# Patient Record
Sex: Male | Born: 1955
Health system: Southern US, Community
[De-identification: ages and names within clinical notes are randomized; demographics above are authoritative.]

## PROBLEM LIST (undated history)

## (undated) DIAGNOSIS — H3320 Serous retinal detachment, unspecified eye: Secondary | ICD-10-CM

## (undated) DIAGNOSIS — E785 Hyperlipidemia, unspecified: Secondary | ICD-10-CM

## (undated) DIAGNOSIS — H35039 Hypertensive retinopathy, unspecified eye: Secondary | ICD-10-CM

## (undated) DIAGNOSIS — I10 Essential (primary) hypertension: Secondary | ICD-10-CM

## (undated) HISTORY — PX: RETINAL DETACHMENT SURGERY: SHX105

## (undated) HISTORY — PX: CATARACT EXTRACTION: SUR2

## (undated) HISTORY — DX: Hypertensive retinopathy, unspecified eye: H35.039

## (undated) HISTORY — PX: EYE SURGERY: SHX253

## (undated) HISTORY — DX: Serous retinal detachment, unspecified eye: H33.20

## (undated) HISTORY — PX: HERNIA REPAIR: SHX51

## (undated) HISTORY — PX: OTHER SURGICAL HISTORY: SHX169

## (undated) HISTORY — DX: Essential (primary) hypertension: I10

## (undated) HISTORY — PX: YAG LASER APPLICATION: SHX6189

---

## 1999-07-24 ENCOUNTER — Encounter: Payer: Self-pay | Admitting: Emergency Medicine

## 1999-07-24 ENCOUNTER — Inpatient Hospital Stay (HOSPITAL_COMMUNITY): Admission: EM | Admit: 1999-07-24 | Discharge: 1999-07-25 | Payer: Self-pay | Admitting: *Deleted

## 2005-12-19 ENCOUNTER — Ambulatory Visit: Payer: Self-pay | Admitting: Emergency Medicine

## 2005-12-21 ENCOUNTER — Ambulatory Visit: Payer: Self-pay | Admitting: Cardiology

## 2006-01-05 ENCOUNTER — Ambulatory Visit: Payer: Self-pay | Admitting: Emergency Medicine

## 2006-01-05 ENCOUNTER — Ambulatory Visit (HOSPITAL_COMMUNITY): Admission: RE | Admit: 2006-01-05 | Discharge: 2006-01-05 | Payer: Self-pay | Admitting: Family Medicine

## 2007-11-07 IMAGING — CT CT CHEST W/ CM
2 of 4 series · 15 of 36 positions shown, 18 images · IV contrast (APPLIED)
Comparison: none

CLINICAL DATA: Hemoptysis.  Elevated blood pressure, on antibiotics. 
 CHEST CT WITH CONTRAST:
TECHNIQUE: Multidetector CT imaging of the chest was performed following the standard protocol during bolus administration of intravenous contrast.
 Contrast:  80 cc Omnipaque 300.

[Series 2: chest_routine 5.0 b40f st · axial · 0.78mm/px · z∈[-315,-30]mm · 12 of 67 slices shown, 15 images]
[im 5/67  mediastinal]
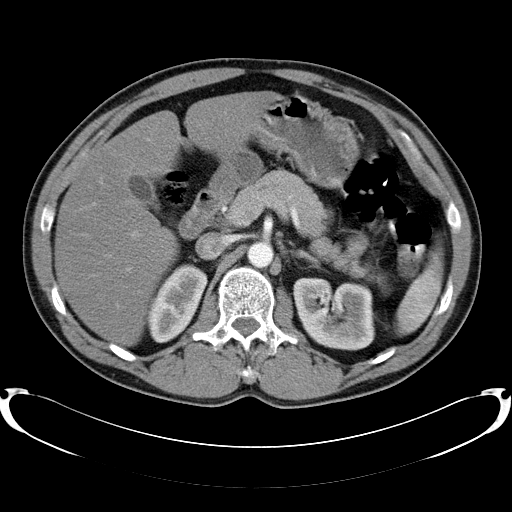
[im 5/67  lung]
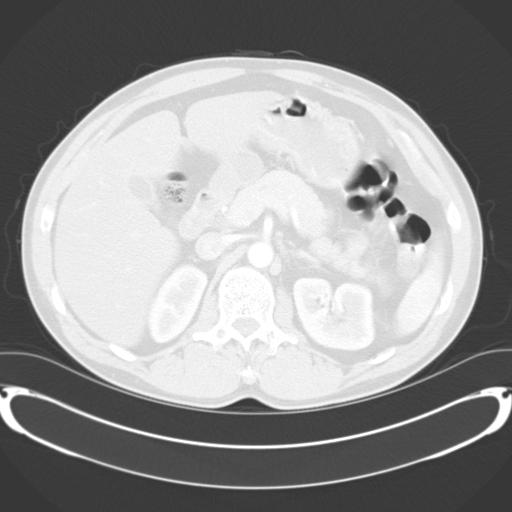
[im 10/67  lung]
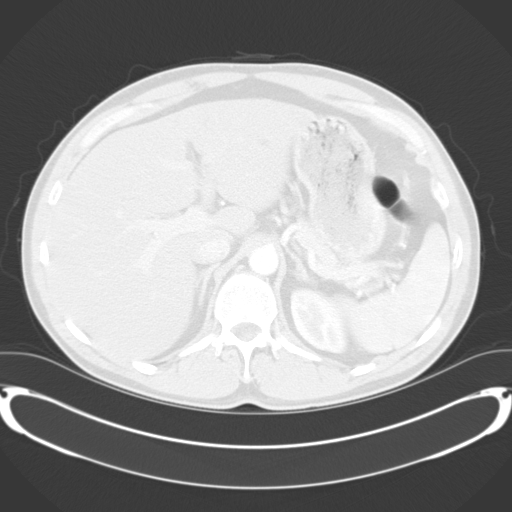
[im 15/67  lung]
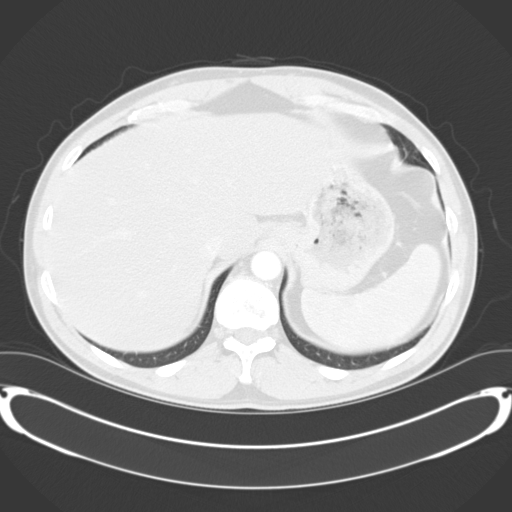
[im 19/67  lung]
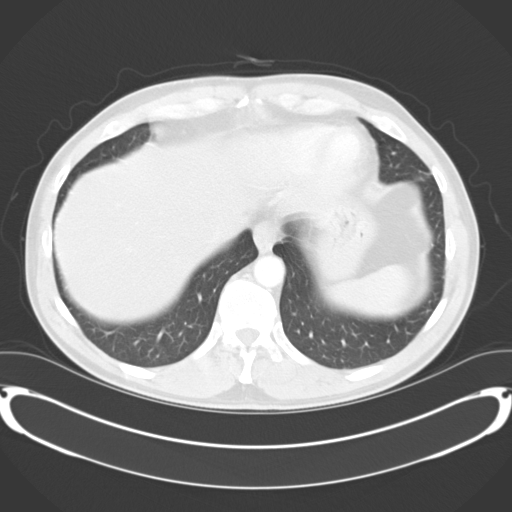
[im 24/67  mediastinal]
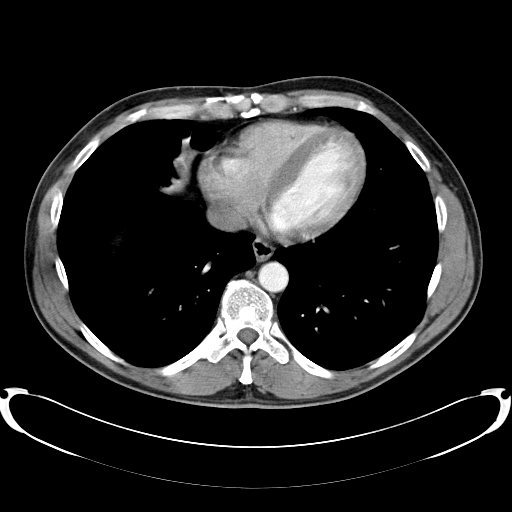
[im 24/67  lung]
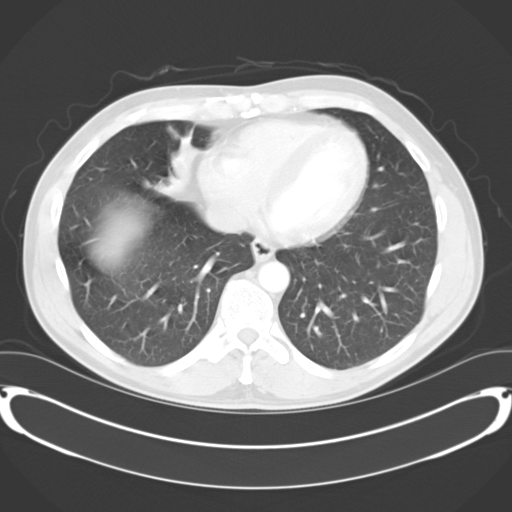
[im 29/67  lung]
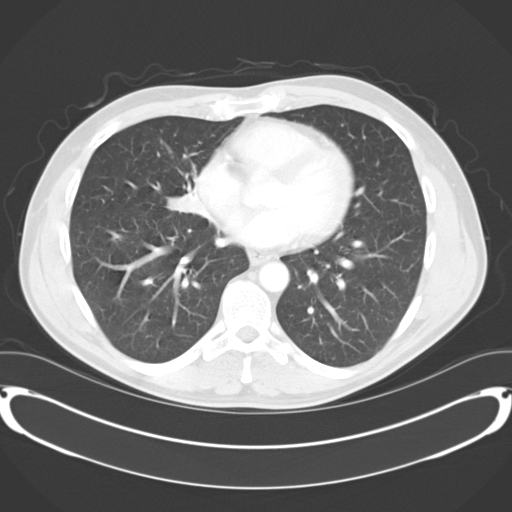
[im 38/67  lung]
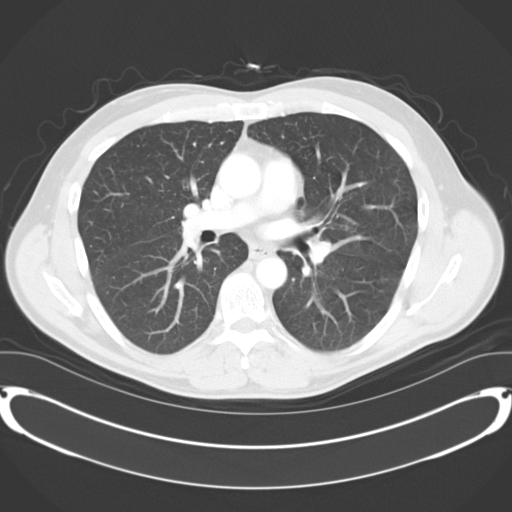
[im 43/67  lung]
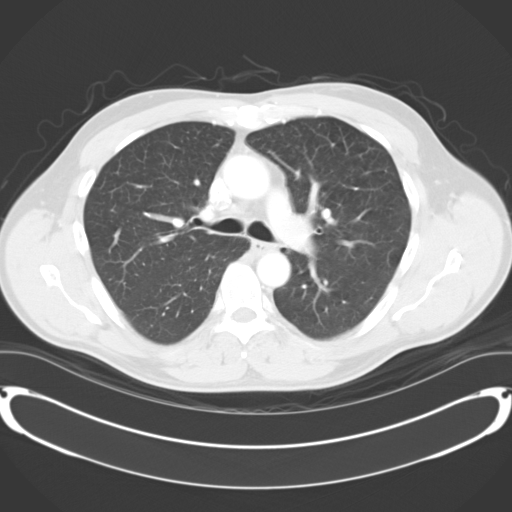
[im 48/67  mediastinal]
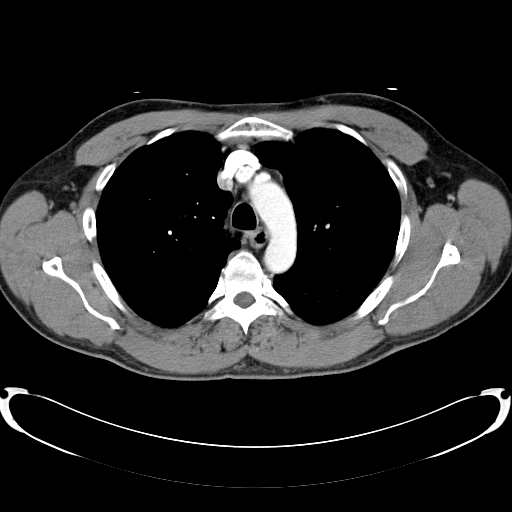
[im 48/67  lung]
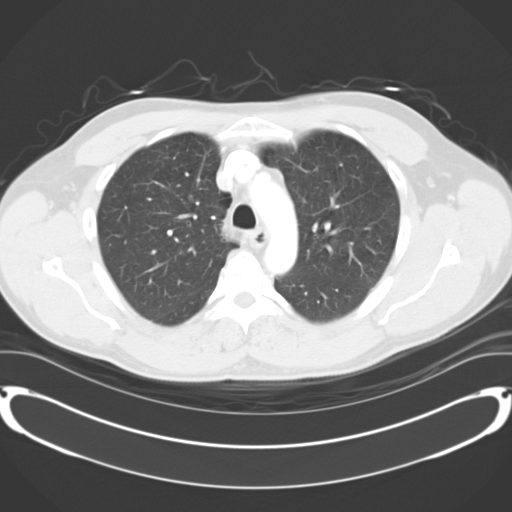
[im 52/67  lung]
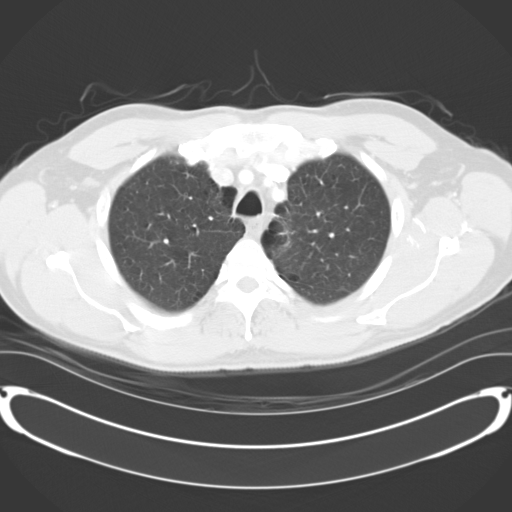
[im 57/67  lung]
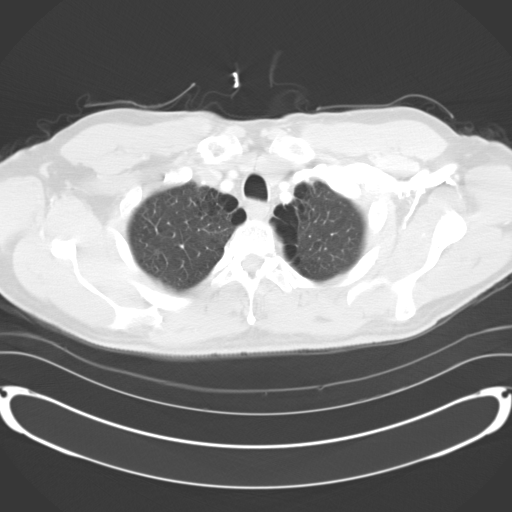
[im 62/67  lung]
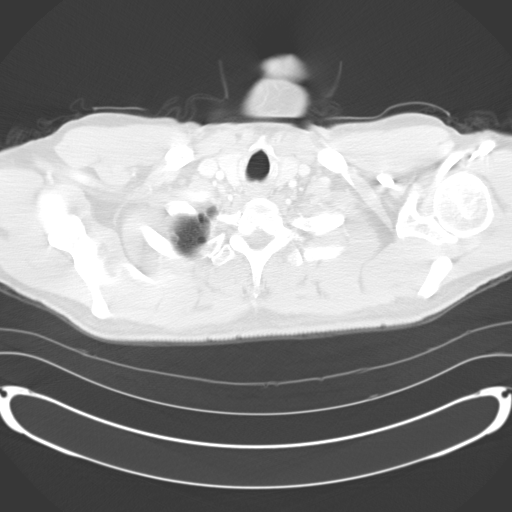

[Series 602: coronal images · coronal · 0.78mm/px · 3 of 39 slices shown]
[im 8/39  lung]
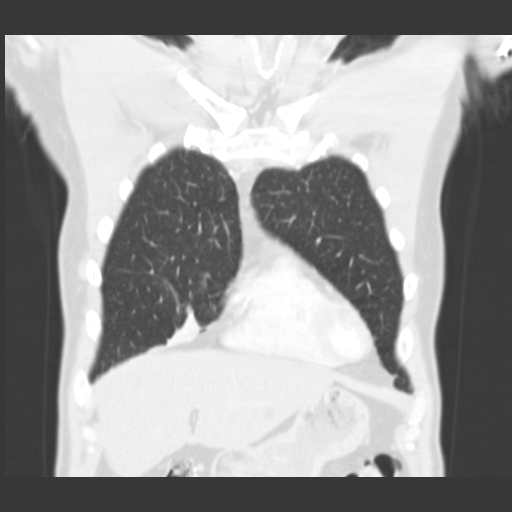
[im 16/39  lung]
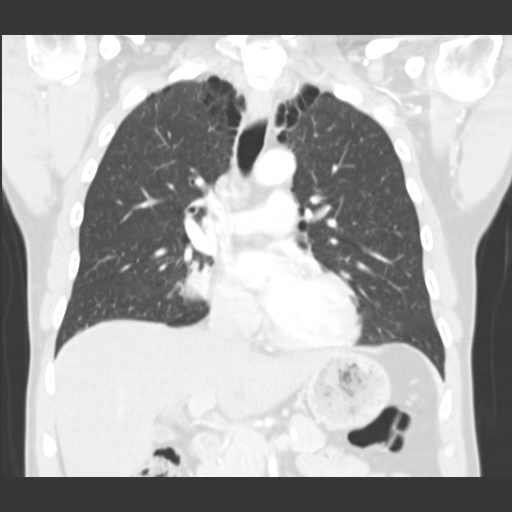
[im 23/39  lung]
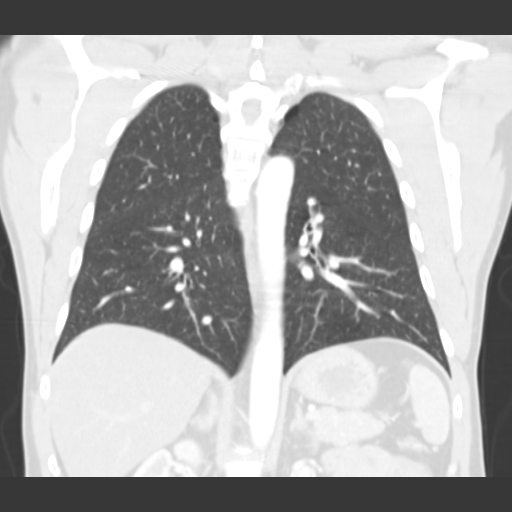

[15 of 36 positions shown; findings below may reference images not displayed]

FINDINGS: Bolus changes are noted primarily on the lung apices as well as medially around the hila.  No lung nodule is seen.   There is opacity within the anteromedial aspect of the right middle lobe consistent with scarring or atelectasis.   Follow-up by chest x-ray may be warranted to insure clearing of this opacity.  No effusion is seen.   No mediastinal or hilar adenopathy is seen.  The pulmonary arteries and thoracic aorta opacify with no significant abnormality noted.  Coronary artery calcifications are present.  There does appear to be diffuse fatty infiltration of the liver.
IMPRESSION: 1.  There is triangular opacity within the anteromedial right middle lobe of questionable significance.  This most likely represents scarring or atelectasis with pneumonia less likely.  Follow-up by chest x-ray is recommended.  No adenopathy. 
 2.  Diffuse fatty infiltration of the liver.  
 3.  Bullous changes particularly in the lung apices.

## 2007-11-22 IMAGING — CR DG CHEST 1V PORT
1 series · 1 of 1 positions shown · non-contrast
Comparison: CT chest of 12/21/05.

CLINICAL DATA: Right upper lobe bronchoscopy.  
 PORTABLE CHEST - 1 VIEW 01/05/06 AT 7736 HOURS:

[view not recorded]
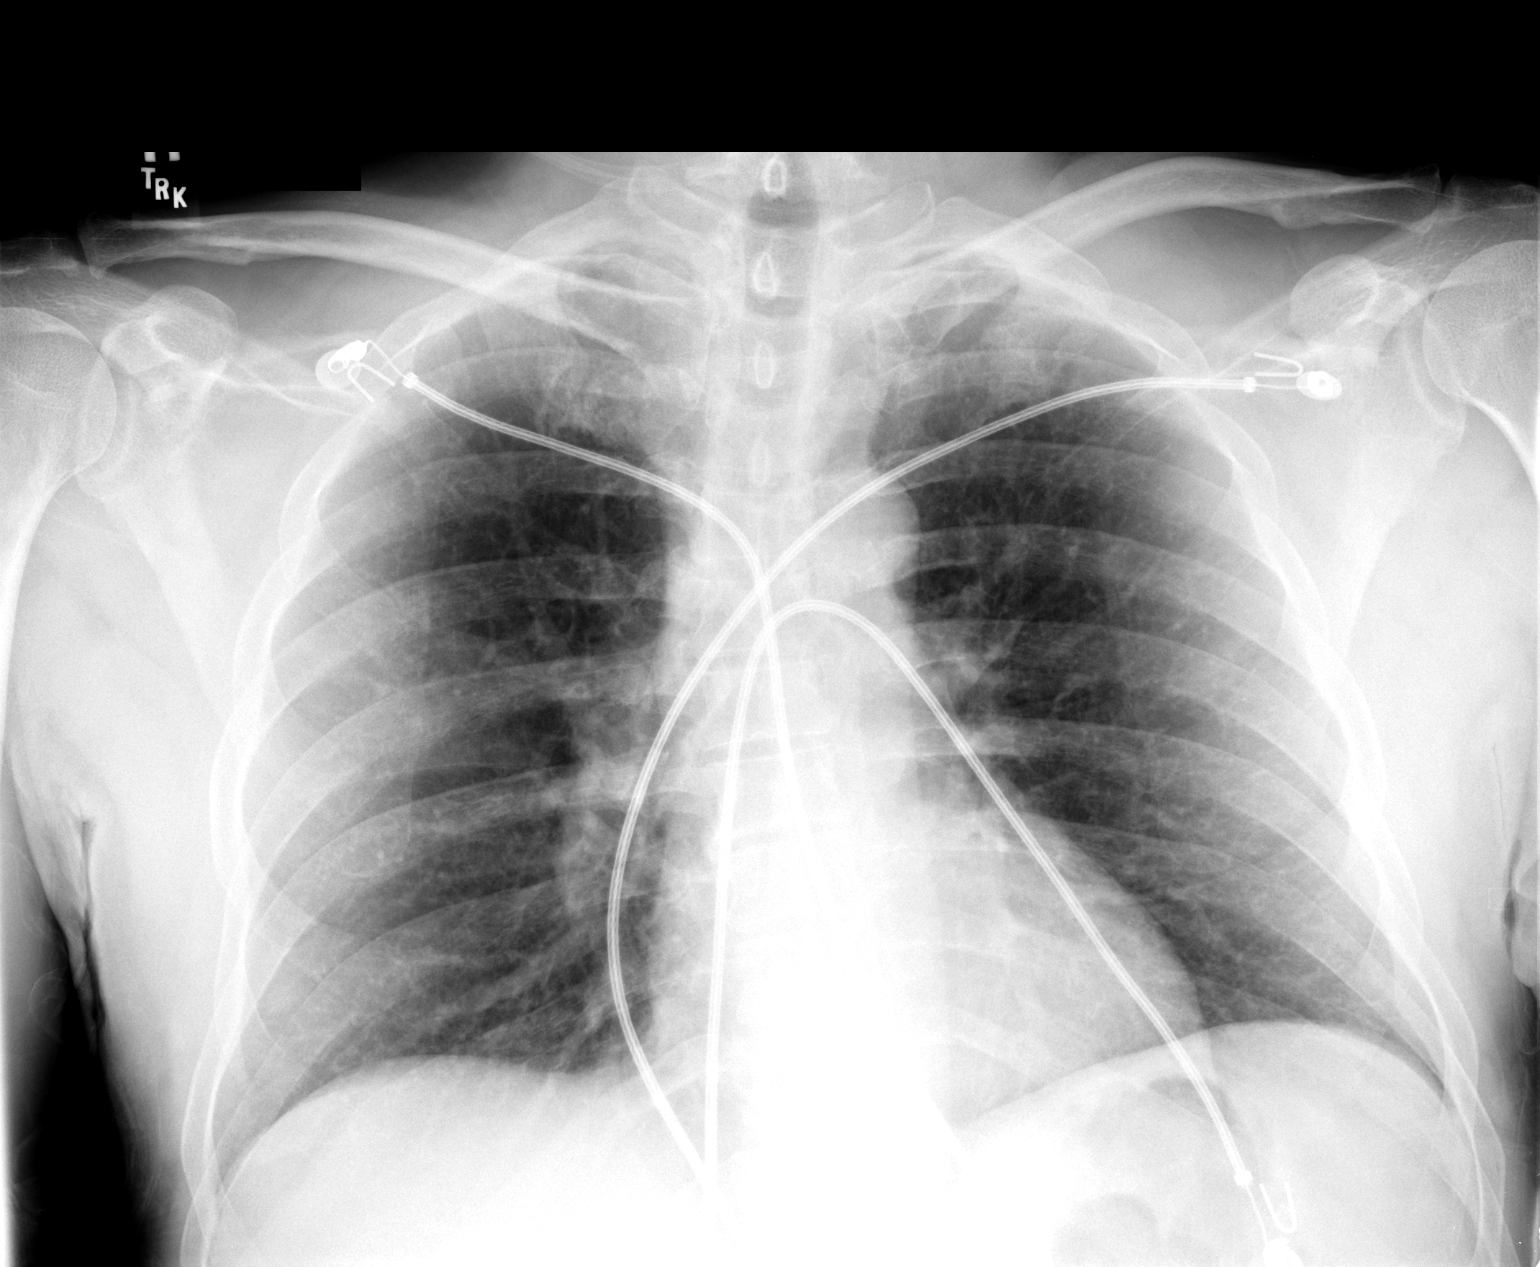

[1 of 1 positions shown; findings below may reference images not displayed]

FINDINGS: No active process is seen.  No pneumothorax is noted.  There is no evidence of effusion.  Heart size is normal.
IMPRESSION: No active lung disease.  No pneumothorax.

## 2015-04-05 DIAGNOSIS — H40003 Preglaucoma, unspecified, bilateral: Secondary | ICD-10-CM | POA: Diagnosis not present

## 2015-04-05 DIAGNOSIS — H2513 Age-related nuclear cataract, bilateral: Secondary | ICD-10-CM | POA: Diagnosis not present

## 2015-04-08 DIAGNOSIS — H2512 Age-related nuclear cataract, left eye: Secondary | ICD-10-CM | POA: Diagnosis not present

## 2015-05-01 DIAGNOSIS — E7800 Pure hypercholesterolemia, unspecified: Secondary | ICD-10-CM | POA: Diagnosis not present

## 2015-05-01 DIAGNOSIS — I10 Essential (primary) hypertension: Secondary | ICD-10-CM | POA: Diagnosis not present

## 2015-05-01 DIAGNOSIS — Z716 Tobacco abuse counseling: Secondary | ICD-10-CM | POA: Diagnosis not present

## 2015-05-01 DIAGNOSIS — Z139 Encounter for screening, unspecified: Secondary | ICD-10-CM | POA: Diagnosis not present

## 2015-05-20 DIAGNOSIS — H2511 Age-related nuclear cataract, right eye: Secondary | ICD-10-CM | POA: Diagnosis not present

## 2015-08-12 DIAGNOSIS — Z139 Encounter for screening, unspecified: Secondary | ICD-10-CM | POA: Diagnosis not present

## 2015-08-12 DIAGNOSIS — E7800 Pure hypercholesterolemia, unspecified: Secondary | ICD-10-CM | POA: Diagnosis not present

## 2015-08-12 DIAGNOSIS — I10 Essential (primary) hypertension: Secondary | ICD-10-CM | POA: Diagnosis not present

## 2015-11-15 DIAGNOSIS — I10 Essential (primary) hypertension: Secondary | ICD-10-CM | POA: Diagnosis not present

## 2015-11-15 DIAGNOSIS — Z139 Encounter for screening, unspecified: Secondary | ICD-10-CM | POA: Diagnosis not present

## 2015-11-15 DIAGNOSIS — E7800 Pure hypercholesterolemia, unspecified: Secondary | ICD-10-CM | POA: Diagnosis not present

## 2016-08-02 DIAGNOSIS — R42 Dizziness and giddiness: Secondary | ICD-10-CM | POA: Diagnosis not present

## 2016-08-02 DIAGNOSIS — I10 Essential (primary) hypertension: Secondary | ICD-10-CM | POA: Diagnosis not present

## 2016-08-02 DIAGNOSIS — H6693 Otitis media, unspecified, bilateral: Secondary | ICD-10-CM | POA: Diagnosis not present

## 2016-08-31 DIAGNOSIS — I10 Essential (primary) hypertension: Secondary | ICD-10-CM | POA: Diagnosis not present

## 2016-08-31 DIAGNOSIS — R42 Dizziness and giddiness: Secondary | ICD-10-CM | POA: Diagnosis not present

## 2016-10-02 DIAGNOSIS — R42 Dizziness and giddiness: Secondary | ICD-10-CM | POA: Diagnosis not present

## 2016-10-02 DIAGNOSIS — Z23 Encounter for immunization: Secondary | ICD-10-CM | POA: Diagnosis not present

## 2016-10-02 DIAGNOSIS — I10 Essential (primary) hypertension: Secondary | ICD-10-CM | POA: Diagnosis not present

## 2016-10-26 DIAGNOSIS — I10 Essential (primary) hypertension: Secondary | ICD-10-CM | POA: Diagnosis not present

## 2016-10-26 DIAGNOSIS — R55 Syncope and collapse: Secondary | ICD-10-CM | POA: Diagnosis not present

## 2016-10-26 DIAGNOSIS — R002 Palpitations: Secondary | ICD-10-CM | POA: Diagnosis not present

## 2016-10-26 DIAGNOSIS — R42 Dizziness and giddiness: Secondary | ICD-10-CM | POA: Diagnosis not present

## 2016-11-27 DIAGNOSIS — R748 Abnormal levels of other serum enzymes: Secondary | ICD-10-CM | POA: Diagnosis not present

## 2016-11-27 DIAGNOSIS — I10 Essential (primary) hypertension: Secondary | ICD-10-CM | POA: Diagnosis not present

## 2016-11-27 DIAGNOSIS — E7800 Pure hypercholesterolemia, unspecified: Secondary | ICD-10-CM | POA: Diagnosis not present

## 2016-12-28 DIAGNOSIS — Z1331 Encounter for screening for depression: Secondary | ICD-10-CM | POA: Diagnosis not present

## 2016-12-28 DIAGNOSIS — I10 Essential (primary) hypertension: Secondary | ICD-10-CM | POA: Diagnosis not present

## 2016-12-28 DIAGNOSIS — G4700 Insomnia, unspecified: Secondary | ICD-10-CM | POA: Diagnosis not present

## 2016-12-28 DIAGNOSIS — E7800 Pure hypercholesterolemia, unspecified: Secondary | ICD-10-CM | POA: Diagnosis not present

## 2016-12-28 DIAGNOSIS — F17200 Nicotine dependence, unspecified, uncomplicated: Secondary | ICD-10-CM | POA: Diagnosis not present

## 2017-02-28 DIAGNOSIS — J019 Acute sinusitis, unspecified: Secondary | ICD-10-CM | POA: Diagnosis not present

## 2017-02-28 DIAGNOSIS — F17200 Nicotine dependence, unspecified, uncomplicated: Secondary | ICD-10-CM | POA: Diagnosis not present

## 2017-02-28 DIAGNOSIS — I10 Essential (primary) hypertension: Secondary | ICD-10-CM | POA: Diagnosis not present

## 2017-02-28 DIAGNOSIS — E7800 Pure hypercholesterolemia, unspecified: Secondary | ICD-10-CM | POA: Diagnosis not present

## 2017-02-28 DIAGNOSIS — Z1331 Encounter for screening for depression: Secondary | ICD-10-CM | POA: Diagnosis not present

## 2017-08-29 DIAGNOSIS — R609 Edema, unspecified: Secondary | ICD-10-CM | POA: Diagnosis not present

## 2017-08-29 DIAGNOSIS — E7800 Pure hypercholesterolemia, unspecified: Secondary | ICD-10-CM | POA: Diagnosis not present

## 2017-08-29 DIAGNOSIS — I10 Essential (primary) hypertension: Secondary | ICD-10-CM | POA: Diagnosis not present

## 2017-08-29 DIAGNOSIS — F17200 Nicotine dependence, unspecified, uncomplicated: Secondary | ICD-10-CM | POA: Diagnosis not present

## 2018-03-01 DIAGNOSIS — Z23 Encounter for immunization: Secondary | ICD-10-CM | POA: Diagnosis not present

## 2018-03-01 DIAGNOSIS — I10 Essential (primary) hypertension: Secondary | ICD-10-CM | POA: Diagnosis not present

## 2018-03-01 DIAGNOSIS — R748 Abnormal levels of other serum enzymes: Secondary | ICD-10-CM | POA: Diagnosis not present

## 2018-03-01 DIAGNOSIS — R040 Epistaxis: Secondary | ICD-10-CM | POA: Diagnosis not present

## 2018-03-01 DIAGNOSIS — E7800 Pure hypercholesterolemia, unspecified: Secondary | ICD-10-CM | POA: Diagnosis not present

## 2018-03-01 DIAGNOSIS — Z1211 Encounter for screening for malignant neoplasm of colon: Secondary | ICD-10-CM | POA: Diagnosis not present

## 2018-03-01 DIAGNOSIS — F17200 Nicotine dependence, unspecified, uncomplicated: Secondary | ICD-10-CM | POA: Diagnosis not present

## 2018-07-24 DIAGNOSIS — H26493 Other secondary cataract, bilateral: Secondary | ICD-10-CM | POA: Diagnosis not present

## 2018-08-21 DIAGNOSIS — H43393 Other vitreous opacities, bilateral: Secondary | ICD-10-CM | POA: Diagnosis not present

## 2018-09-02 DIAGNOSIS — Z125 Encounter for screening for malignant neoplasm of prostate: Secondary | ICD-10-CM | POA: Diagnosis not present

## 2018-09-02 DIAGNOSIS — E7800 Pure hypercholesterolemia, unspecified: Secondary | ICD-10-CM | POA: Diagnosis not present

## 2018-09-02 DIAGNOSIS — I10 Essential (primary) hypertension: Secondary | ICD-10-CM | POA: Diagnosis not present

## 2018-09-02 DIAGNOSIS — F17200 Nicotine dependence, unspecified, uncomplicated: Secondary | ICD-10-CM | POA: Diagnosis not present

## 2018-09-02 DIAGNOSIS — Z1331 Encounter for screening for depression: Secondary | ICD-10-CM | POA: Diagnosis not present

## 2018-09-04 DIAGNOSIS — E7800 Pure hypercholesterolemia, unspecified: Secondary | ICD-10-CM | POA: Diagnosis not present

## 2018-09-04 DIAGNOSIS — I10 Essential (primary) hypertension: Secondary | ICD-10-CM | POA: Diagnosis not present

## 2018-09-04 DIAGNOSIS — Z125 Encounter for screening for malignant neoplasm of prostate: Secondary | ICD-10-CM | POA: Diagnosis not present

## 2018-09-28 DIAGNOSIS — L02212 Cutaneous abscess of back [any part, except buttock]: Secondary | ICD-10-CM | POA: Diagnosis not present

## 2018-10-23 DIAGNOSIS — Z1212 Encounter for screening for malignant neoplasm of rectum: Secondary | ICD-10-CM | POA: Diagnosis not present

## 2018-10-23 DIAGNOSIS — Z1211 Encounter for screening for malignant neoplasm of colon: Secondary | ICD-10-CM | POA: Diagnosis not present

## 2019-03-03 DIAGNOSIS — E7800 Pure hypercholesterolemia, unspecified: Secondary | ICD-10-CM | POA: Diagnosis not present

## 2019-03-03 DIAGNOSIS — Z87891 Personal history of nicotine dependence: Secondary | ICD-10-CM | POA: Diagnosis not present

## 2019-03-03 DIAGNOSIS — R202 Paresthesia of skin: Secondary | ICD-10-CM | POA: Diagnosis not present

## 2019-03-03 DIAGNOSIS — I10 Essential (primary) hypertension: Secondary | ICD-10-CM | POA: Diagnosis not present

## 2019-03-03 DIAGNOSIS — R609 Edema, unspecified: Secondary | ICD-10-CM | POA: Diagnosis not present

## 2019-03-07 DIAGNOSIS — R252 Cramp and spasm: Secondary | ICD-10-CM | POA: Diagnosis not present

## 2019-03-07 DIAGNOSIS — R748 Abnormal levels of other serum enzymes: Secondary | ICD-10-CM | POA: Diagnosis not present

## 2019-03-07 DIAGNOSIS — R202 Paresthesia of skin: Secondary | ICD-10-CM | POA: Diagnosis not present

## 2019-03-07 DIAGNOSIS — E7800 Pure hypercholesterolemia, unspecified: Secondary | ICD-10-CM | POA: Diagnosis not present

## 2019-03-12 ENCOUNTER — Other Ambulatory Visit: Payer: Self-pay | Admitting: Family Medicine

## 2019-03-12 DIAGNOSIS — F17200 Nicotine dependence, unspecified, uncomplicated: Secondary | ICD-10-CM

## 2019-03-12 DIAGNOSIS — Z87891 Personal history of nicotine dependence: Secondary | ICD-10-CM

## 2019-03-19 DIAGNOSIS — S60512A Abrasion of left hand, initial encounter: Secondary | ICD-10-CM | POA: Diagnosis not present

## 2019-03-19 DIAGNOSIS — M545 Low back pain: Secondary | ICD-10-CM | POA: Diagnosis not present

## 2019-03-19 DIAGNOSIS — Z6828 Body mass index (BMI) 28.0-28.9, adult: Secondary | ICD-10-CM | POA: Diagnosis not present

## 2019-04-01 DIAGNOSIS — M5416 Radiculopathy, lumbar region: Secondary | ICD-10-CM | POA: Diagnosis not present

## 2019-04-01 DIAGNOSIS — Z6828 Body mass index (BMI) 28.0-28.9, adult: Secondary | ICD-10-CM | POA: Diagnosis not present

## 2019-04-14 DIAGNOSIS — Z23 Encounter for immunization: Secondary | ICD-10-CM | POA: Diagnosis not present

## 2019-05-12 DIAGNOSIS — Z23 Encounter for immunization: Secondary | ICD-10-CM | POA: Diagnosis not present

## 2019-09-03 DIAGNOSIS — E7800 Pure hypercholesterolemia, unspecified: Secondary | ICD-10-CM | POA: Diagnosis not present

## 2019-09-03 DIAGNOSIS — R748 Abnormal levels of other serum enzymes: Secondary | ICD-10-CM | POA: Diagnosis not present

## 2019-09-05 DIAGNOSIS — R739 Hyperglycemia, unspecified: Secondary | ICD-10-CM | POA: Diagnosis not present

## 2019-09-05 DIAGNOSIS — E782 Mixed hyperlipidemia: Secondary | ICD-10-CM | POA: Diagnosis not present

## 2019-09-05 DIAGNOSIS — I10 Essential (primary) hypertension: Secondary | ICD-10-CM | POA: Diagnosis not present

## 2019-09-05 DIAGNOSIS — R609 Edema, unspecified: Secondary | ICD-10-CM | POA: Diagnosis not present

## 2019-09-05 DIAGNOSIS — Z1331 Encounter for screening for depression: Secondary | ICD-10-CM | POA: Diagnosis not present

## 2019-09-25 DIAGNOSIS — Z125 Encounter for screening for malignant neoplasm of prostate: Secondary | ICD-10-CM | POA: Diagnosis not present

## 2019-09-25 DIAGNOSIS — Z6827 Body mass index (BMI) 27.0-27.9, adult: Secondary | ICD-10-CM | POA: Diagnosis not present

## 2019-09-25 DIAGNOSIS — Z79899 Other long term (current) drug therapy: Secondary | ICD-10-CM | POA: Diagnosis not present

## 2019-09-25 DIAGNOSIS — R739 Hyperglycemia, unspecified: Secondary | ICD-10-CM | POA: Diagnosis not present

## 2019-09-25 DIAGNOSIS — I10 Essential (primary) hypertension: Secondary | ICD-10-CM | POA: Diagnosis not present

## 2019-09-25 DIAGNOSIS — Z23 Encounter for immunization: Secondary | ICD-10-CM | POA: Diagnosis not present

## 2019-09-25 DIAGNOSIS — Z1331 Encounter for screening for depression: Secondary | ICD-10-CM | POA: Diagnosis not present

## 2019-10-10 DIAGNOSIS — E790 Hyperuricemia without signs of inflammatory arthritis and tophaceous disease: Secondary | ICD-10-CM | POA: Diagnosis not present

## 2020-03-26 DIAGNOSIS — E790 Hyperuricemia without signs of inflammatory arthritis and tophaceous disease: Secondary | ICD-10-CM | POA: Diagnosis not present

## 2020-03-26 DIAGNOSIS — E782 Mixed hyperlipidemia: Secondary | ICD-10-CM | POA: Diagnosis not present

## 2020-03-26 DIAGNOSIS — I10 Essential (primary) hypertension: Secondary | ICD-10-CM | POA: Diagnosis not present

## 2020-03-26 DIAGNOSIS — R748 Abnormal levels of other serum enzymes: Secondary | ICD-10-CM | POA: Diagnosis not present

## 2020-03-26 DIAGNOSIS — R739 Hyperglycemia, unspecified: Secondary | ICD-10-CM | POA: Diagnosis not present

## 2020-08-19 ENCOUNTER — Encounter (INDEPENDENT_AMBULATORY_CARE_PROVIDER_SITE_OTHER): Payer: BC Managed Care – PPO | Admitting: Ophthalmology

## 2020-08-19 ENCOUNTER — Other Ambulatory Visit: Payer: Self-pay

## 2020-08-19 DIAGNOSIS — I10 Essential (primary) hypertension: Secondary | ICD-10-CM | POA: Diagnosis not present

## 2020-08-19 DIAGNOSIS — H33051 Total retinal detachment, right eye: Secondary | ICD-10-CM | POA: Diagnosis not present

## 2020-08-19 DIAGNOSIS — H35033 Hypertensive retinopathy, bilateral: Secondary | ICD-10-CM

## 2020-08-19 DIAGNOSIS — D3131 Benign neoplasm of right choroid: Secondary | ICD-10-CM

## 2020-08-19 DIAGNOSIS — H43813 Vitreous degeneration, bilateral: Secondary | ICD-10-CM

## 2020-08-20 ENCOUNTER — Encounter (INDEPENDENT_AMBULATORY_CARE_PROVIDER_SITE_OTHER): Payer: Self-pay | Admitting: Ophthalmology

## 2020-08-20 ENCOUNTER — Ambulatory Visit (INDEPENDENT_AMBULATORY_CARE_PROVIDER_SITE_OTHER): Payer: BC Managed Care – PPO | Admitting: Ophthalmology

## 2020-08-20 DIAGNOSIS — I10 Essential (primary) hypertension: Secondary | ICD-10-CM

## 2020-08-20 DIAGNOSIS — H35033 Hypertensive retinopathy, bilateral: Secondary | ICD-10-CM

## 2020-08-20 DIAGNOSIS — H3581 Retinal edema: Secondary | ICD-10-CM | POA: Diagnosis not present

## 2020-08-20 DIAGNOSIS — H3322 Serous retinal detachment, left eye: Secondary | ICD-10-CM

## 2020-08-20 DIAGNOSIS — Z961 Presence of intraocular lens: Secondary | ICD-10-CM

## 2020-08-20 NOTE — Progress Notes (Signed)
[facility] Note  08/20/2020     CHIEF COMPLAINT Patient presents for Eye Problem   HISTORY OF PRESENT ILLNESS: Parker Hall is a 65 y.o. male who presents to the clinic today for:   HPI   Total retinal detachment OS- Patient states no changes since yesterday. Denies FOLs or floaters OS... He does have floaters OD.  Last edited by Leonie Douglas, [place] on 08/20/2020  8:41 AM.    Pt states Dr. Zigmund Daniel yesterday and was dx with a total RD OS, pt states he was referred to Dr. Zigmund Daniel by Dr. Hulan Saas, pt states he feels like his vision in the left eye has been decreasing for 5-6 wks, but has been unable to get an eye exam, pt has had cataract sx with Dr. Brigitte Pulse at [facility] about 4-5 years ago  Referring physician: No referring provider defined for this encounter.  HISTORICAL INFORMATION:   Selected notes from the MEDICAL RECORD NUMBER Referred by Dr. Zigmund Daniel Ocular Hx-Total Retinal Detachment OS    CURRENT MEDICATIONS: No current outpatient medications on file. (Ophthalmic Drugs)   No current facility-administered medications for this visit. (Ophthalmic Drugs)   Current Outpatient Medications (Other)  Medication Sig   doxazosin (CARDURA) 2 MG tablet Take 2 mg by mouth daily.   ezetimibe (ZETIA) 10 MG tablet Take 10 mg by mouth daily.   Cholecalciferol (VITAMIN D-3) 125 MCG (5000 UT) TABS Take 5,000 Units by mouth daily.   fluticasone (FLONASE) 50 MCG/ACT nasal spray Place 2 sprays into both nostrils daily as needed for allergies or rhinitis.   Multiple Vitamins-Minerals (MULTIVITAMIN WITH MINERALS) tablet Take 1 tablet by mouth daily.   No current facility-administered medications for this visit. (Other)   REVIEW OF SYSTEMS: ROS   Negative for: Constitutional, Gastrointestinal, Neurological, Skin, Genitourinary, Musculoskeletal, HENT, Endocrine, Cardiovascular, Eyes, Respiratory, Psychiatric, Allergic/Imm, Heme/Lymph Last edited by Leonie Douglas, COA on 08/20/2020  8:41 AM.      ALLERGIES No Known Allergies  PAST MEDICAL HISTORY Past Medical History:  Diagnosis Date   Hypertension    Hypertensive retinopathy    Past Surgical History:  Procedure Laterality Date   CATARACT EXTRACTION     YAG LASER APPLICATION      FAMILY HISTORY Family History  Problem Relation Age of Onset   Hypertension Mother     SOCIAL HISTORY Social History   Tobacco Use   Smoking status: Every Day    Types: Cigarettes  Substance Use Topics   Alcohol use: Yes         OPHTHALMIC EXAM:  Base Eye Exam     Visual Acuity (Snellen - Linear)       Right Left   Dist [place] 20/20 -2 20/250 +1   Dist ph [place]  20/150         Tonometry (Tonopen, 8:54 AM)       Right Left   Pressure 18 16         Pupils       Dark Light Shape React APD   Right 3 2 Round Brisk None   Left 3 3.5 Round Brisk +1         Visual Fields (Counting fingers)       Left Right     Full   Restrictions Total superior nasal, inferior nasal deficiencies; Partial outer superior temporal deficiency          Extraocular Movement       Right Left  Full Full         Neuro/Psych     Oriented x3: Yes   Mood/Affect: Normal         Dilation     Both eyes: 1.0% Mydriacyl, 2.5% Phenylephrine @ 8:54 AM         Dilation #2     Both eyes: 1 gtt 10% OU @ 9:50 AM           Slit Lamp and Fundus Exam     External Exam       Right Left   External Normal Normal         Slit Lamp Exam       Right Left   Lids/Lashes Dermatochalasis - upper lid, Meibomian gland dysfunction, Telangiectasia Dermatochalasis - upper lid, Meibomian gland dysfunction, Telangiectasia   Conjunctiva/Sclera White and quiet White and quiet   Cornea arcus, trace PEE, well healed cataract wound arcus, 1+ inferior Punctate epithelial erosions   Anterior Chamber Deep and quiet Deep and quiet   Iris Round and dilated Round and dilated   Lens PC IOL in good  position with open PC PC IOL in good position with open PC   Vitreous Vitreous syneresis, Posterior vitreous detachment, vitreous condensations Vitreous syneresis, 3+pigment         Fundus Exam       Right Left   Disc Pink and Sharp, +cupping, peripapillary nevus approx 0.7DD at 0600 disc mild Pallor, Sharp rim   C/D Ratio 0.7 0.6   Macula Flat, Blunted foveal reflex, RPE mottling, No heme or edema detached   Vessels attenuated, Tortuous, mild Copper wiring attenuated, Tortuous   Periphery Attached    bullous total RD with +corrugations and 2 HST at 1000 and 1100           Refraction     Manifest Refraction       Sphere Cylinder Dist VA   Right +1.00 Sphere 20/20   Left +1.00 Sphere 20/80            IMAGING AND PROCEDURES  Imaging and Procedures for 08/20/2020  Color Fundus Photography Optos - OU - Both Eyes       Right Eye Progression has no prior data. Disc findings include (Pigmented choroidal lesion at 0600, approx 8DD, +cupping). Macula : retinal pigment epithelium abnormalities. Vessels : attenuated, tortuous vessels. Periphery : RPE abnormality.   Left Eye Progression has no prior data. Disc findings include (Hazy view). Macula : (Hazy view, detached). Vessels : tortuous vessels, attenuated. Periphery : detachment.   Notes **Images stored on drive**  Impression: OS: total detachment      OCT, Retina - OU - Both Eyes       Right Eye Quality was good. Central Foveal Thickness: 276. Progression has no prior data. Findings include normal foveal contour, no IRF, no SRF.   Left Eye Central Foveal Thickness: 1144. Progression has no prior data. Findings include abnormal foveal contour, intraretinal fluid, subretinal fluid (Total RD).   Notes *Images captured and stored on drive  Diagnosis / Impression:  OD: NFP, no IRF/SRF OS: total RD w/ +IRF  Clinical management:  See below  Abbreviations: NFP - Normal foveal profile. CME - cystoid macular  edema. PED - pigment epithelial detachment. IRF - intraretinal fluid. SRF - subretinal fluid. EZ - ellipsoid zone. ERM - epiretinal membrane. ORA - outer retinal atrophy. ORT - outer retinal tubulation. SRHM - subretinal hyper-reflective material. IRHM - intraretinal hyper-reflective material  ASSESSMENT/PLAN:    ICD-10-CM   1. Left retinal detachment  H33.22 Color Fundus Photography Optos - OU - Both Eyes    2. Retinal edema  H35.81 OCT, Retina - OU - Both Eyes    CANCELED: OCT, Retina - OU - Both Eyes    3. Essential hypertension  I10     4. Hypertensive retinopathy of both eyes  H35.033     5. Pseudophakia, both eyes  Z96.1       1,2. Rhegmatogenous retinal detachment, OS - bullous total mac off detachment, pt reports decreased vision for 5-6 wks - two horseshoe tears at 1000 and 1100 - The incidence, risk factors, and natural history of retinal detachment was discussed with patient.   - Potential treatment options including delimiting laser, pneumatic retinopexy, scleral buckle, and vitrectomy, cryotherapy and laser, and the use of air, gas, and oil discussed with patient. - The risks of blindness, loss of vision, infection, hemorrhage, lens displacement were discussed with patient. - recommend scleral buckle procedure + 25g PPV/EL/Gas vs oil OS under general anesthesia - pt wishes to proceed with surgery - RBA of procedure discussed, questions answered - informed consent obtained and signed - surgery scheduled for August 26, 2020 at 11:30AM, [facility] OR 8 - f/u POD1  3,4. Hypertensive retinopathy OU - discussed importance of tight BP control - monitor  5. Pseudophakia OU  - s/p CE/IOL OU (Dr. Manuella Ghazi)  - IOL in good position, doing well  - monitor  Ophthalmic Meds Ordered this visit:  No orders of the defined types were placed in this encounter.    Return in about 1 week (around 08/27/2020) for POV.  There are no Patient Instructions on file for this  visit.   Explained the diagnoses, plan, and follow up with the patient and they expressed understanding.  Patient expressed understanding of the importance of proper follow up care.  This document serves as a record of services personally performed by Gardiner Sleeper, MD, PhD. It was created on their behalf by Leonie Douglas, an ophthalmic technician. The creation of this record is the provider's dictation and/or activities during the visit.    Electronically signed by: Leonie Douglas COA, 08/23/20  8:00 PM  This document serves as a record of services personally performed by Gardiner Sleeper, MD, PhD. It was created on their behalf by San Jetty. Owens Shark, OA an ophthalmic technician. The creation of this record is the provider's dictation and/or activities during the visit.    Electronically signed by: San Jetty. Owens Shark, [place] 08.19.2022 8:00 PM  Gardiner Sleeper, M.D., Ph.D. Diseases & Surgery of the Retina and [place] 08/20/2020  I have reviewed the above documentation for accuracy and completeness, and I agree with the above. Gardiner Sleeper, M.D., Ph.D. 08/23/20 8:45 PM   Abbreviations: M myopia (nearsighted); A astigmatism; H hyperopia (farsighted); P presbyopia; Mrx spectacle prescription;  CTL contact lenses; OD right eye; OS left eye; OU both eyes  XT exotropia; ET esotropia; PEK punctate epithelial keratitis; PEE punctate epithelial erosions; DES dry eye syndrome; MGD meibomian gland dysfunction; ATs artificial tears; PFAT's preservative free artificial tears; [place]ly nuclear sclerotic cataract; PSC posterior subcapsular cataract; ERM epi-retinal membrane; PVD posterior vitreous detachment; RD retinal detachment; DM diabetes mellitus; DR diabetic retinopathy; NPDR non-proliferative diabetic retinopathy; PDR proliferative diabetic retinopathy; CSME clinically significant macular edema; DME diabetic macular edema; dbh dot blot hemorrhages; CWS cotton wool spot; POAG  primary open angle glaucoma; C/D cup-to-disc  ratio; HVF humphrey visual field; GVF goldmann visual field; OCT optical coherence tomography; IOP intraocular pressure; BRVO Branch retinal vein occlusion; CRVO central retinal vein occlusion; CRAO central retinal artery occlusion; BRAO branch retinal artery occlusion; RT retinal tear; SB scleral buckle; PPV pars plana vitrectomy; VH Vitreous hemorrhage; PRP panretinal laser photocoagulation; IVK intravitreal kenalog; VMT vitreomacular traction; MH Macular hole;  NVD neovascularization of the disc; NVE neovascularization elsewhere; AREDS age related eye disease study; ARMD age related macular degeneration; POAG primary open angle glaucoma; EBMD epithelial/anterior basement membrane dystrophy; ACIOL anterior chamber intraocular lens; IOL intraocular lens; PCIOL posterior chamber intraocular lens; Phaco/IOL phacoemulsification with intraocular lens placement; Stover photorefractive keratectomy; LASIK laser assisted in situ keratomileusis; HTN hypertension; DM diabetes mellitus; COPD chronic obstructive pulmonary disease

## 2020-08-23 ENCOUNTER — Encounter (INDEPENDENT_AMBULATORY_CARE_PROVIDER_SITE_OTHER): Payer: Self-pay | Admitting: Ophthalmology

## 2020-08-23 NOTE — H&P (Signed)
Parker Hall is an 65 y.o. male.    Chief Complaint: retinal detachment, left eye  HPI: Pt with 5-6 wk history of decreased vision OS. On dilated exam, found to have a total retinal detachment. After a discussion of the risks benefits and alternatives to surgery, the patient has elected to proceed with surgical repair of total retinal detachment OS -- scleral buckle + 25g PPV w/ endolaser and gas vs oil OS under general anesthesia.    Past Medical History:  Diagnosis Date   Hypertension    Hypertensive retinopathy     Past Surgical History:  Procedure Laterality Date   CATARACT EXTRACTION     YAG LASER APPLICATION      Family History  Problem Relation Age of Onset   Hypertension Mother    Social History:  reports that he has been smoking cigarettes. He does not have any smokeless tobacco history on file. He reports current alcohol use. No history on file for drug use.  Allergies: No Known Allergies  No medications prior to admission.    Review of systems otherwise negative  There were no vitals taken for this visit.  Physical exam: Mental status: oriented x3. Eyes: See eye exam associated with this date of surgery Ears, Nose, Throat: within normal limits Neck: Within Normal limits General: within normal limits Chest: Within normal limits Breast: deferred Heart: Within normal limits Abdomen: Within normal limits GU: deferred Extremities: within normal limits Skin: within normal limits  Assessment/Plan Total retinal detachment, LEFT EYE  Plan: To [facility] for retinal detachment repair OS -- scleral buckle + 25g PPV w/ endolaser and gas vs oil OS under general anesthesia - case scheduled for Thursday, 08.25.22, 1130 am, [facility] OR 08  Gardiner Sleeper, M.D., Ph.D. Vitreoretinal Surgeon [facility]

## 2020-08-25 ENCOUNTER — Encounter (HOSPITAL_COMMUNITY): Payer: Self-pay | Admitting: Ophthalmology

## 2020-08-25 DIAGNOSIS — Z01818 Encounter for other preprocedural examination: Secondary | ICD-10-CM | POA: Diagnosis not present

## 2020-08-25 DIAGNOSIS — H3322 Serous retinal detachment, left eye: Secondary | ICD-10-CM | POA: Diagnosis not present

## 2020-08-25 DIAGNOSIS — I10 Essential (primary) hypertension: Secondary | ICD-10-CM | POA: Diagnosis not present

## 2020-08-25 DIAGNOSIS — R748 Abnormal levels of other serum enzymes: Secondary | ICD-10-CM | POA: Diagnosis not present

## 2020-08-25 DIAGNOSIS — R7303 Prediabetes: Secondary | ICD-10-CM | POA: Diagnosis not present

## 2020-08-25 NOTE — Progress Notes (Signed)
DUE TO COVID-19 ONLY ONE VISITOR IS ALLOWED TO COME WITH YOU AND STAY IN THE WAITING ROOM ONLY DURING PRE OP AND PROCEDURE DAY OF SURGERY.  PCP - Dr Wayne Sever Cardiologist - n/a  Chest x-ray - n/a EKG - n/a Stress Test - n/a ECHO - n/a Cardiac Cath - n/a  Sleep Study -  n/a CPAP - none  STOP now taking any Aspirin (unless otherwise instructed by your surgeon), Aleve, Naproxen, Ibuprofen, Motrin, Advil, Goody's, BC's, all herbal medications, fish oil, and all vitamins.   Coronavirus Screening Covid test n/a - Ambulatory Surgery  Do you have any of the following symptoms:  Cough yes/no: No Fever (>100.46F)  yes/no: No Runny nose yes/no: No Sore throat yes/no: No Difficulty breathing/shortness of breath  yes/no: No  Have you traveled in the last 14 days and where? yes/no: No  Patient verbalized understanding of instructions that were given via phone.

## 2020-08-26 ENCOUNTER — Other Ambulatory Visit: Payer: Self-pay

## 2020-08-26 ENCOUNTER — Ambulatory Visit (HOSPITAL_COMMUNITY)
Admission: RE | Admit: 2020-08-26 | Discharge: 2020-08-26 | Disposition: A | Discharge status: Home / Self Care | Payer: BC Managed Care – PPO | Attending: Ophthalmology | Admitting: Ophthalmology

## 2020-08-26 ENCOUNTER — Ambulatory Visit (HOSPITAL_COMMUNITY): Payer: BC Managed Care – PPO | Admitting: Anesthesiology

## 2020-08-26 ENCOUNTER — Encounter (HOSPITAL_COMMUNITY)
Admission: RE | Disposition: A | Discharge status: Home / Self Care | Payer: Self-pay | Source: Home / Self Care | Attending: Ophthalmology

## 2020-08-26 ENCOUNTER — Encounter (HOSPITAL_COMMUNITY): Payer: Self-pay | Admitting: Ophthalmology

## 2020-08-26 DIAGNOSIS — Z9849 Cataract extraction status, unspecified eye: Secondary | ICD-10-CM | POA: Insufficient documentation

## 2020-08-26 DIAGNOSIS — F17210 Nicotine dependence, cigarettes, uncomplicated: Secondary | ICD-10-CM | POA: Diagnosis not present

## 2020-08-26 DIAGNOSIS — H3322 Serous retinal detachment, left eye: Secondary | ICD-10-CM | POA: Diagnosis not present

## 2020-08-26 DIAGNOSIS — H3342 Traction detachment of retina, left eye: Secondary | ICD-10-CM | POA: Insufficient documentation

## 2020-08-26 DIAGNOSIS — H33022 Retinal detachment with multiple breaks, left eye: Secondary | ICD-10-CM | POA: Diagnosis not present

## 2020-08-26 DIAGNOSIS — H33052 Total retinal detachment, left eye: Secondary | ICD-10-CM | POA: Diagnosis not present

## 2020-08-26 DIAGNOSIS — I10 Essential (primary) hypertension: Secondary | ICD-10-CM | POA: Diagnosis not present

## 2020-08-26 DIAGNOSIS — E785 Hyperlipidemia, unspecified: Secondary | ICD-10-CM | POA: Diagnosis not present

## 2020-08-26 DIAGNOSIS — H35039 Hypertensive retinopathy, unspecified eye: Secondary | ICD-10-CM | POA: Diagnosis not present

## 2020-08-26 HISTORY — PX: GAS/FLUID EXCHANGE: SHX5334

## 2020-08-26 HISTORY — PX: GAS INSERTION: SHX5336

## 2020-08-26 HISTORY — PX: LASER PHOTO ABLATION: SHX5942

## 2020-08-26 HISTORY — DX: Hyperlipidemia, unspecified: E78.5

## 2020-08-26 HISTORY — PX: PERFLUORONE INJECTION: SHX5302

## 2020-08-26 HISTORY — PX: VITRECTOMY 25 GAUGE WITH SCLERAL BUCKLE: SHX6183

## 2020-08-26 HISTORY — PX: REPAIR OF COMPLEX TRACTION RETINAL DETACHMENT: SHX6217

## 2020-08-26 LAB — CBC
HCT: 45.7 % (ref 39.0–52.0)
Hemoglobin: 15.7 g/dL (ref 13.0–17.0)
MCH: 34.4 pg — ABNORMAL HIGH (ref 26.0–34.0)
MCHC: 34.4 g/dL (ref 30.0–36.0)
MCV: 100.2 fL — ABNORMAL HIGH (ref 80.0–100.0)
Platelets: 169 10*3/uL (ref 150–400)
RBC: 4.56 MIL/uL (ref 4.22–5.81)
RDW: 12.5 % (ref 11.5–15.5)
WBC: 3.5 10*3/uL — ABNORMAL LOW (ref 4.0–10.5)
nRBC: 0 % (ref 0.0–0.2)

## 2020-08-26 SURGERY — REPAIR, RETINAL DETACHMENT, COMPLEX
Anesthesia: General | Site: Eye | Laterality: Left

## 2020-08-26 MED ORDER — CHLORHEXIDINE GLUCONATE 0.12 % MT SOLN
15.0000 mL | Freq: Once | OROMUCOSAL | Status: AC
  Administered 2020-08-26: 15 mL via OROMUCOSAL
  Filled 2020-08-26: qty 15

## 2020-08-26 MED ORDER — PHENYLEPHRINE HCL-NACL 20-0.9 MG/250ML-% IV SOLN: INTRAVENOUS | Status: DC | PRN

## 2020-08-26 MED ORDER — SODIUM CHLORIDE (PF) 0.9 % IJ SOLN
INTRAMUSCULAR | Status: AC
  Filled 2020-08-26: qty 10

## 2020-08-26 MED ORDER — MIDAZOLAM HCL 2 MG/2ML IJ SOLN
INTRAMUSCULAR | Status: DC | PRN
  Administered 2020-08-26: 2 mg via INTRAVENOUS

## 2020-08-26 MED ORDER — ROCURONIUM BROMIDE 10 MG/ML (PF) SYRINGE
PREFILLED_SYRINGE | INTRAVENOUS | Status: AC
  Filled 2020-08-26: qty 10

## 2020-08-26 MED ORDER — INDOCYANINE GREEN 25 MG IV SOLR
INTRAVENOUS | Status: AC
  Filled 2020-08-26: qty 10

## 2020-08-26 MED ORDER — GLYCOPYRROLATE PF 0.2 MG/ML IJ SOSY
PREFILLED_SYRINGE | INTRAMUSCULAR | Status: AC
  Filled 2020-08-26: qty 1

## 2020-08-26 MED ORDER — STERILE WATER FOR INJECTION IJ SOLN
INTRAMUSCULAR | Status: DC | PRN
  Administered 2020-08-26: 200 mL

## 2020-08-26 MED ORDER — LIDOCAINE 2% (20 MG/ML) 5 ML SYRINGE
INTRAMUSCULAR | Status: DC | PRN
  Administered 2020-08-26: 60 mg via INTRAVENOUS

## 2020-08-26 MED ORDER — DEXAMETHASONE SODIUM PHOSPHATE 10 MG/ML IJ SOLN
INTRAMUSCULAR | Status: DC | PRN
  Administered 2020-08-26: 5 mg via INTRAVENOUS

## 2020-08-26 MED ORDER — DORZOLAMIDE HCL-TIMOLOL MAL 2-0.5 % OP SOLN
OPHTHALMIC | Status: AC
  Filled 2020-08-26: qty 10

## 2020-08-26 MED ORDER — 0.9 % SODIUM CHLORIDE (POUR BTL) OPTIME
TOPICAL | Status: DC | PRN
  Administered 2020-08-26: 1000 mL

## 2020-08-26 MED ORDER — TRIAMCINOLONE ACETONIDE 40 MG/ML IJ SUSP
INTRAMUSCULAR | Status: DC | PRN
  Administered 2020-08-26: 40 mg

## 2020-08-26 MED ORDER — TROPICAMIDE 1 % OP SOLN
1.0000 [drp] | OPHTHALMIC | Status: AC | PRN
  Administered 2020-08-26 (×3): 1 [drp] via OPHTHALMIC
  Filled 2020-08-26: qty 15

## 2020-08-26 MED ORDER — PROPOFOL 10 MG/ML IV BOLUS
INTRAVENOUS | Status: DC | PRN
Start: 2020-08-26 — End: 2020-08-26
  Administered 2020-08-26: 60 mg via INTRAVENOUS
  Administered 2020-08-26: 140 mg via INTRAVENOUS

## 2020-08-26 MED ORDER — FENTANYL CITRATE (PF) 100 MCG/2ML IJ SOLN
INTRAMUSCULAR | Status: DC | PRN
  Administered 2020-08-26 (×3): 50 ug via INTRAVENOUS

## 2020-08-26 MED ORDER — BACITRACIN-POLYMYXIN B 500-10000 UNIT/GM OP OINT
TOPICAL_OINTMENT | OPHTHALMIC | Status: DC | PRN
  Administered 2020-08-26: 1 via OPHTHALMIC

## 2020-08-26 MED ORDER — BRIMONIDINE TARTRATE 0.2 % OP SOLN
OPHTHALMIC | Status: DC | PRN
  Administered 2020-08-26: 1 [drp] via OPHTHALMIC

## 2020-08-26 MED ORDER — DEXTROSE 5 % IV SOLN
INTRAVENOUS | Status: AC
  Filled 2020-08-26: qty 100

## 2020-08-26 MED ORDER — SUGAMMADEX SODIUM 200 MG/2ML IV SOLN
INTRAVENOUS | Status: DC | PRN
  Administered 2020-08-26: 200 mg via INTRAVENOUS

## 2020-08-26 MED ORDER — DEXAMETHASONE SODIUM PHOSPHATE 10 MG/ML IJ SOLN
INTRAMUSCULAR | Status: AC
  Filled 2020-08-26: qty 1

## 2020-08-26 MED ORDER — BALANCED SALT IO SOLN
INTRAOCULAR | Status: DC | PRN
  Administered 2020-08-26: 15 mL via INTRAOCULAR

## 2020-08-26 MED ORDER — TOBRAMYCIN-DEXAMETHASONE 0.3-0.1 % OP OINT
TOPICAL_OINTMENT | OPHTHALMIC | Status: AC
  Filled 2020-08-26: qty 3.5

## 2020-08-26 MED ORDER — TRIAMCINOLONE ACETONIDE 40 MG/ML IJ SUSP
INTRAMUSCULAR | Status: AC
  Filled 2020-08-26: qty 5

## 2020-08-26 MED ORDER — HYDROCODONE-ACETAMINOPHEN 5-325 MG PO TABS: 1.0000 | ORAL_TABLET | ORAL | 0 refills | Status: AC | PRN

## 2020-08-26 MED ORDER — PROMETHAZINE HCL 25 MG/ML IJ SOLN: 6.2500 mg | INTRAMUSCULAR | Status: DC | PRN

## 2020-08-26 MED ORDER — ATROPINE SULFATE 1 % OP SOLN
1.0000 [drp] | OPHTHALMIC | Status: AC | PRN
  Administered 2020-08-26 (×3): 1 [drp] via OPHTHALMIC
  Filled 2020-08-26: qty 5

## 2020-08-26 MED ORDER — NA CHONDROIT SULF-NA HYALURON 40-30 MG/ML IO SOSY
INTRAOCULAR | Status: DC | PRN
  Administered 2020-08-26: 0.5 mL via INTRAOCULAR

## 2020-08-26 MED ORDER — LIDOCAINE HCL 1 % IJ SOLN
INTRAMUSCULAR | Status: AC
  Filled 2020-08-26: qty 20

## 2020-08-26 MED ORDER — PROPARACAINE HCL 0.5 % OP SOLN
1.0000 [drp] | OPHTHALMIC | Status: AC | PRN
  Administered 2020-08-26 (×3): 1 [drp] via OPHTHALMIC
  Filled 2020-08-26: qty 15

## 2020-08-26 MED ORDER — NA CHONDROIT SULF-NA HYALURON 40-30 MG/ML IO SOSY
INTRAOCULAR | Status: AC
  Filled 2020-08-26: qty 1

## 2020-08-26 MED ORDER — GLYCOPYRROLATE PF 0.2 MG/ML IJ SOSY
PREFILLED_SYRINGE | INTRAMUSCULAR | Status: DC | PRN
  Administered 2020-08-26: .1 mg via INTRAVENOUS

## 2020-08-26 MED ORDER — BSS IO SOLN
INTRAOCULAR | Status: AC
  Filled 2020-08-26: qty 15

## 2020-08-26 MED ORDER — STERILE WATER FOR INJECTION IJ SOLN
INTRAMUSCULAR | Status: DC | PRN
  Administered 2020-08-26: 20 mL

## 2020-08-26 MED ORDER — BRIMONIDINE TARTRATE 0.2 % OP SOLN
OPHTHALMIC | Status: AC
  Filled 2020-08-26: qty 5

## 2020-08-26 MED ORDER — FENTANYL CITRATE (PF) 100 MCG/2ML IJ SOLN: 25.0000 ug | INTRAMUSCULAR | Status: DC | PRN

## 2020-08-26 MED ORDER — PREDNISOLONE ACETATE 1 % OP SUSP
OPHTHALMIC | Status: DC | PRN
  Administered 2020-08-26: 1 [drp] via OPHTHALMIC

## 2020-08-26 MED ORDER — ATROPINE SULFATE 1 % OP SOLN
OPHTHALMIC | Status: DC | PRN
  Administered 2020-08-26: 1 [drp] via OPHTHALMIC

## 2020-08-26 MED ORDER — MEPERIDINE HCL 25 MG/ML IJ SOLN: 6.2500 mg | INTRAMUSCULAR | Status: DC | PRN

## 2020-08-26 MED ORDER — GATIFLOXACIN 0.5 % OP SOLN
OPHTHALMIC | Status: AC
  Filled 2020-08-26: qty 2.5

## 2020-08-26 MED ORDER — PHENYLEPHRINE HCL 10 % OP SOLN
1.0000 [drp] | OPHTHALMIC | Status: AC | PRN
  Administered 2020-08-26 (×3): 1 [drp] via OPHTHALMIC
  Filled 2020-08-26: qty 5

## 2020-08-26 MED ORDER — ACETAZOLAMIDE SODIUM 500 MG IJ SOLR
INTRAMUSCULAR | Status: AC
  Filled 2020-08-26: qty 500

## 2020-08-26 MED ORDER — CEFTAZIDIME 1 G IJ SOLR
INTRAMUSCULAR | Status: AC
  Filled 2020-08-26: qty 1

## 2020-08-26 MED ORDER — LIDOCAINE HCL 1 % IJ SOLN
INTRAMUSCULAR | Status: DC | PRN
  Administered 2020-08-26: 10 mL

## 2020-08-26 MED ORDER — EPINEPHRINE PF 1 MG/ML IJ SOLN
INTRAMUSCULAR | Status: AC
  Filled 2020-08-26: qty 1

## 2020-08-26 MED ORDER — ATROPINE SULFATE 1 % OP SOLN
OPHTHALMIC | Status: AC
  Filled 2020-08-26: qty 5

## 2020-08-26 MED ORDER — ONDANSETRON HCL 4 MG/2ML IJ SOLN
INTRAMUSCULAR | Status: AC
  Filled 2020-08-26: qty 2

## 2020-08-26 MED ORDER — DEXMEDETOMIDINE HCL IN NACL 200 MCG/50ML IV SOLN
INTRAVENOUS | Status: AC
  Filled 2020-08-26: qty 50

## 2020-08-26 MED ORDER — FENTANYL CITRATE (PF) 250 MCG/5ML IJ SOLN
INTRAMUSCULAR | Status: AC
  Filled 2020-08-26: qty 5

## 2020-08-26 MED ORDER — ONDANSETRON HCL 4 MG/2ML IJ SOLN
INTRAMUSCULAR | Status: DC | PRN
  Administered 2020-08-26: 4 mg via INTRAVENOUS

## 2020-08-26 MED ORDER — PHENYLEPHRINE HCL-NACL 20-0.9 MG/250ML-% IV SOLN
INTRAVENOUS | Status: DC | PRN
  Administered 2020-08-26: 25 ug/min via INTRAVENOUS

## 2020-08-26 MED ORDER — DEXMEDETOMIDINE HCL IN NACL 200 MCG/50ML IV SOLN
INTRAVENOUS | Status: DC | PRN
  Administered 2020-08-26 (×2): 8 ug via INTRAVENOUS
  Administered 2020-08-26: 24 ug via INTRAVENOUS

## 2020-08-26 MED ORDER — PREDNISOLONE ACETATE 1 % OP SUSP
OPHTHALMIC | Status: AC
  Filled 2020-08-26: qty 5

## 2020-08-26 MED ORDER — MIDAZOLAM HCL 2 MG/2ML IJ SOLN
INTRAMUSCULAR | Status: AC
  Filled 2020-08-26: qty 2

## 2020-08-26 MED ORDER — POLYMYXIN B SULFATE 500000 UNITS IJ SOLR
INTRAMUSCULAR | Status: AC
  Filled 2020-08-26: qty 500000

## 2020-08-26 MED ORDER — BUPIVACAINE HCL (PF) 0.75 % IJ SOLN
INTRAMUSCULAR | Status: DC | PRN
  Administered 2020-08-26: 10 mL

## 2020-08-26 MED ORDER — STERILE WATER FOR INJECTION IJ SOLN
INTRAMUSCULAR | Status: AC
  Filled 2020-08-26: qty 10

## 2020-08-26 MED ORDER — PROPOFOL 10 MG/ML IV BOLUS
INTRAVENOUS | Status: AC
  Filled 2020-08-26: qty 20

## 2020-08-26 MED ORDER — ROCURONIUM BROMIDE 10 MG/ML (PF) SYRINGE
PREFILLED_SYRINGE | INTRAVENOUS | Status: DC | PRN
  Administered 2020-08-26: 30 mg via INTRAVENOUS
  Administered 2020-08-26: 20 mg via INTRAVENOUS
  Administered 2020-08-26: 70 mg via INTRAVENOUS
  Administered 2020-08-26: 30 mg via INTRAVENOUS
  Administered 2020-08-26: 20 mg via INTRAVENOUS

## 2020-08-26 MED ORDER — SODIUM CHLORIDE 0.9 % IV SOLN: INTRAVENOUS | Status: DC

## 2020-08-26 MED ORDER — BACITRACIN-POLYMYXIN B 500-10000 UNIT/GM OP OINT
TOPICAL_OINTMENT | OPHTHALMIC | Status: AC
  Filled 2020-08-26: qty 3.5

## 2020-08-26 MED ORDER — CARBACHOL 0.01 % IO SOLN
INTRAOCULAR | Status: AC
  Filled 2020-08-26: qty 1.5

## 2020-08-26 MED ORDER — GATIFLOXACIN 0.5 % OP SOLN OPTIME - NO CHARGE
OPHTHALMIC | Status: DC | PRN
  Administered 2020-08-26: 1 [drp] via OPHTHALMIC

## 2020-08-26 MED ORDER — LIDOCAINE HCL (PF) 2 % IJ SOLN
INTRAMUSCULAR | Status: AC
  Filled 2020-08-26: qty 10

## 2020-08-26 MED ORDER — EPINEPHRINE PF 1 MG/ML IJ SOLN
INTRAOCULAR | Status: DC | PRN
  Administered 2020-08-26: 500 mL

## 2020-08-26 MED ORDER — ORAL CARE MOUTH RINSE: 15.0000 mL | Freq: Once | OROMUCOSAL | Status: AC

## 2020-08-26 MED ORDER — BSS PLUS IO SOLN
INTRAOCULAR | Status: AC
  Filled 2020-08-26: qty 500

## 2020-08-26 MED ORDER — BUPIVACAINE HCL (PF) 0.75 % IJ SOLN
INTRAMUSCULAR | Status: AC
  Filled 2020-08-26: qty 10

## 2020-08-26 SURGICAL SUPPLY — 67 items
APPLICATOR COTTON TIP 6 STRL (MISCELLANEOUS) ×4 IMPLANT
APPLICATOR COTTON TIP 6IN STRL (MISCELLANEOUS) ×8
BAND SCLERAL BUCKLING TYPE 41 (Ophthalmic Related) ×2 IMPLANT
BAND WRIST GAS GREEN (MISCELLANEOUS) IMPLANT
BLADE EYE CATARACT 19 1.4 BEAV (BLADE) IMPLANT
BLADE MVR KNIFE 20G (BLADE) ×2 IMPLANT
BNDG EYE OVAL (GAUZE/BANDAGES/DRESSINGS) ×2 IMPLANT
CABLE BIPOLOR RESECTION CORD (MISCELLANEOUS) ×2 IMPLANT
CANNULA ANT CHAM MAIN (OPHTHALMIC RELATED) IMPLANT
CANNULA FLEX TIP 25G (CANNULA) ×2 IMPLANT
CANNULA TROCAR 23 GA VLV (OPHTHALMIC) IMPLANT
CANNULA VLV SOFT TIP 25GA (OPHTHALMIC) IMPLANT
CLSR STERI-STRIP ANTIMIC 1/2X4 (GAUZE/BANDAGES/DRESSINGS) ×2 IMPLANT
COTTONBALL LRG STERILE PKG (GAUZE/BANDAGES/DRESSINGS) ×6 IMPLANT
DRAPE MICROSCOPE LEICA 46X105 (MISCELLANEOUS) ×2 IMPLANT
DRAPE OPHTHALMIC 77X100 STRL (CUSTOM PROCEDURE TRAY) ×2 IMPLANT
ERASER HMR WETFIELD 23G BP (MISCELLANEOUS) IMPLANT
FILTER BLUE MILLIPORE (MISCELLANEOUS) IMPLANT
FILTER STRAW FLUID ASPIR (MISCELLANEOUS) IMPLANT
FORCEPS GRIESHABER ILM 25G A (INSTRUMENTS) IMPLANT
GAS AUTO FILL CONSTEL (OPHTHALMIC)
GAS AUTO FILL CONSTELLATION (OPHTHALMIC) IMPLANT
GAS IO PFP 125GM ISPAN CNSTL (MISCELLANEOUS) ×1 IMPLANT
GAS TANK CONSTELL (MISCELLANEOUS) ×2
GAS WRIST BAND GREEN (MISCELLANEOUS)
GLOVE SURG ENC MOIS LTX SZ7.5 (GLOVE) ×4 IMPLANT
GLOVE SURG ENC TEXT LTX SZ7 (GLOVE) ×2 IMPLANT
GOWN STRL REUS W/ TWL LRG LVL3 (GOWN DISPOSABLE) ×2 IMPLANT
GOWN STRL REUS W/ TWL XL LVL3 (GOWN DISPOSABLE) ×1 IMPLANT
GOWN STRL REUS W/TWL LRG LVL3 (GOWN DISPOSABLE) ×4
GOWN STRL REUS W/TWL XL LVL3 (GOWN DISPOSABLE) ×2
KIT BASIN OR (CUSTOM PROCEDURE TRAY) ×2 IMPLANT
KIT PERFLUORON PROCEDURE 5ML (MISCELLANEOUS) ×2 IMPLANT
LENS MACULAR ASPHERIC CONSTEL (OPHTHALMIC) IMPLANT
LENS VITRECTOMY FLAT OCLR DISP (MISCELLANEOUS) IMPLANT
LOOP FINESSE 25 GA (MISCELLANEOUS) IMPLANT
MICROPICK 25G (MISCELLANEOUS)
NEEDLE 18GX1X1/2 (RX/OR ONLY) (NEEDLE) ×2 IMPLANT
NEEDLE 25GX 5/8IN NON SAFETY (NEEDLE) ×6 IMPLANT
NEEDLE HYPO 30X.5 LL (NEEDLE) ×4 IMPLANT
NEEDLE PRECISIONGLIDE 27X1.5 (NEEDLE) IMPLANT
NS IRRIG 1000ML POUR BTL (IV SOLUTION) ×2 IMPLANT
PACK VITRECTOMY CUSTOM (CUSTOM PROCEDURE TRAY) ×2 IMPLANT
PAD ARMBOARD 7.5X6 YLW CONV (MISCELLANEOUS) ×4 IMPLANT
PAK PIK VITRECTOMY CVS 25GA (OPHTHALMIC) ×2 IMPLANT
PENCIL BIPOLAR 25GA STR DISP (OPHTHALMIC RELATED) ×2 IMPLANT
PICK MICROPICK 25G (MISCELLANEOUS) IMPLANT
PROBE DIATHERMY DSP 27GA (MISCELLANEOUS) IMPLANT
PROBE ENDO DIATHERMY 25G (MISCELLANEOUS) IMPLANT
PROBE LASER ILLUM FLEX CVD 25G (OPHTHALMIC) IMPLANT
REPL STRA BRUSH NEEDLE (NEEDLE) IMPLANT
RESERVOIR BACK FLUSH (MISCELLANEOUS) IMPLANT
RETRACTOR IRIS FLEX 25G GRIESH (INSTRUMENTS) IMPLANT
SET INJECTOR OIL FLUID CONSTEL (OPHTHALMIC) IMPLANT
SOL ANTI FOG 6CC (MISCELLANEOUS) ×1 IMPLANT
SOLUTION ANTI FOG 6CC (MISCELLANEOUS) ×1
SPONGE SURGIFOAM ABS GEL 12-7 (HEMOSTASIS) IMPLANT
STOPCOCK 4 WAY LG BORE MALE ST (IV SETS) IMPLANT
SUT VICRYL 7 0 TG140 8 (SUTURE) ×2 IMPLANT
SYR 10ML LL (SYRINGE) ×2 IMPLANT
SYR 20ML LL LF (SYRINGE) ×2 IMPLANT
SYR 5ML LL (SYRINGE) ×2 IMPLANT
SYR BULB EAR ULCER 3OZ GRN STR (SYRINGE) ×2 IMPLANT
SYR TB 1ML LUER SLIP (SYRINGE) ×4 IMPLANT
TOWEL GREEN STERILE FF (TOWEL DISPOSABLE) ×2 IMPLANT
TUBING HIGH PRESS EXTEN 6IN (TUBING) ×2 IMPLANT
WATER STERILE IRR 1000ML POUR (IV SOLUTION) ×2 IMPLANT

## 2020-08-26 NOTE — Transfer of Care (Signed)
Immediate Anesthesia Transfer of Care Note  Patient: Parker Hall  Procedure(s) Performed: REPAIR OF COMPLEX TRACTION RETINAL DETACHMENT (Left: Eye) VITRECTOMY 25 GAUGE WITH SCLERAL BUCKLE (Left: Eye) LASER PHOTO ABLATION (Left: Eye) PERFLUORONE INJECTION (Left: Eye) GAS/FLUID EXCHANGE (Left: Eye) INSERTION OF GAS (Left: Eye)  Patient Location: PACU  Anesthesia Type:General  Level of Consciousness: awake, alert  and oriented  Airway & Oxygen Therapy: Patient connected to face mask oxygen  Post-op Assessment: Post -op Vital signs reviewed and stable  Post vital signs: stable  Last Vitals:  Vitals Value Taken Time  BP 115/68 08/26/20 1603  Temp    Pulse 82 08/26/20 1603  Resp 18 08/26/20 1603  SpO2 92 % 08/26/20 1603  Vitals shown include unvalidated device data.  Last Pain:  Vitals:   08/26/20 0926  TempSrc:   PainSc: 0-No pain         Complications: No notable events documented.

## 2020-08-26 NOTE — Op Note (Signed)
Date of procedure: 08.25.22   Surgeon: Bernarda Caffey, MD, PhD   Assistant: Ernest Mallick, Ophthalmic Assistant    Pre-operative Diagnosis: Macula involving Rhegmatogenous Retinal Detachment, Left Eye   Post-operative diagnosis: Macula involving Rhegmatogenous Retinal Detachment, Left Eye   Anesthesia: GETA   Procedures: 1)     Scleral Buckle, Left Eye 2)     25 gauge pars plana vitrectomy, Left Eye CPT [phone] 3)     Perfluorocarbon injection 4)     Fluid-air exchange, Left Eye 5)     Endolaser, Left Eye 5)     Injection of [id number] gas   Complications: none Estimated blood loss: minimal Specimens: none   Brief history:   The patient has a history of decreased vision in the affected left eye, and on examination, was noted to have a macula-involving total retinal detachment, affecting activities of daily living.  The risks, benefits, and alternatives were explained to the patient, including pain, bleeding, infection, loss of vision, double vision, droopy eyelids, and need for more surgeries.  Informed consent was obtained from the patient and placed in the chart.     Procedure:             The patient was brought to the preoperative holding area where the correct eye was confirmed and marked.  The patient was then brought to the operating room where general endotracheal anesthesia was induced. A secondary time-out was performed to identify the correct patient, eyes, procedures, and any allergies. The left eye was prepped and draped in the usual sterile ophthalmic fashion followed by placement of a lid speculum.             A 360 conjunctival peritomy was created using Westcott scissors and 0.12 forceps. Each of the four quadrants between the rectus muscles was dissected using Stevens scissors to detach Tenon's attachments from the globe. Each of the four rectus muscles was isolated on a muscle hook and slung using 2-0 Silk suture in the usual standard fashion. Each of the four quadrants  between the rectus muscles was inspected and there were noted to be no areas of scleral thinning. A [id number] silicone band was then brought onto the field and was threaded under each rectus muscle. The band was then loosely secured using a #70 Watzke sleeve in the superonasal quadrant. The band was then sutured to the sclera in each quadrant using 5-0 nylon sutures passed partial thickness through the sclera in a horizontal mattress fashion. The scleral buckle was then tightened to the appropriate height with two locking needle drivers. Attention was then turned to the vitrectomy portion of the procedure.              A 25 gauge trocar was placed in the inferotemporal quadrant in a beveled fashion. A 4 mm infusion cannula was placed through this trocar, and the infusion cannula was confirmed in the vitreous cavity with no incarceration of retina or choroid prior to turning it on. Two additional 25 gauge trocars were placed in the superonasal and superotemporal quadrants (2 and 10 oclock, respectively) in a similar beveled fashion. At this time, a standard three-port pars plana vitrectomy was performed using the light pipe, the cutter, and the BIOM viewing system. A thorough anterior, core and peripheral vitreous dissection was performed. A posterior vitreous detachment was confirmed over the optic nerve. There was a total retinal detachment involving the macula and fovea. There were multiple retinal tears -- two large tears at 1000 and 1100 oclock,  and 3 smaller tears at 0600, 0700 and 0800 oclock.             Traction was removed from all retinal breaks. The breaks were trimmed using the cutter to smooth the edges and marked with diathermy. Perfluoron was injected to push the subretinal fluid anterior to the scleral buckle. Under perfluoron, endolaser was applied over the posterior edge of the scleral buckle and just posterior to the buckle. Then, a complete fluid-air exchange was performed with a soft tip extrusion  cannula over the breaks, then posteriorly to remove the perfluoron. After completion of these maneuvers, the retina was flat over the macula and over the scleral buckle. Under air, endolaser was applied to all the breaks and over and anterior to the scleral buckle to the ora.             At this time, the buckle height was confirmed and the buckle was finalized by trimming the band ends. The superotemporal trocar was removed and sutured with 7-0 vicryl in an interrupted fashion.  A complete air to 14% C3F8 gas exchange was performed through the infusion cannula and vented through the superonasal trocar using the extrusion cannula. The superonasal trocar and infusion cannula and associated trocar were then removed and sutured with 7-0 vicryl in an interrupted fashion. Kefzol + polymixin irrigation was then used over the buckle. A subtenon's block containing 0.75% marcaine and 2% lidocaine was administered.              The conjunctiva was closed with 7-0 vicryl sutures. The eye's intraocular pressure was confirmed to be at a physiologic level by digital palpation. Subconjunctival injections of Antibiotic and kenalog were administered. The lid speculum and drapes were removed. Drops of an antibiotic, antihypertensives, and steroid were given. Copious antibiotic ointment was instilled into the eye. The eye was patched and shielded. The patient tolerated the procedure well without any intraoperative or immediate postoperative complications. The patient was taken to the recovery room in good condition. The patient was instructed to maintain a strict face-down position and will be seen by Dr. Coralyn Pear tomorrow morning in clinic.

## 2020-08-26 NOTE — Anesthesia Preprocedure Evaluation (Addendum)
Anesthesia Evaluation  Patient identified by MRN, date of birth, ID band Patient awake    Reviewed: Allergy & Precautions, NPO status , Patient's Chart, lab work & pertinent test results  Airway Mallampati: II  TM Distance: >3 FB Neck ROM: Full    Dental  (+) Teeth Intact, Dental Advisory Given,    Pulmonary Current Smoker,    Pulmonary exam normal breath sounds clear to auscultation       Cardiovascular hypertension, Normal cardiovascular exam Rhythm:Regular Rate:Normal     Neuro/Psych negative neurological ROS     GI/Hepatic negative GI ROS, Neg liver ROS,   Endo/Other  negative endocrine ROS  Renal/GU negative Renal ROS     Musculoskeletal negative musculoskeletal ROS (+)   Abdominal   Peds  Hematology negative hematology ROS (+)   Anesthesia Other Findings   Reproductive/Obstetrics                            Anesthesia Physical Anesthesia Plan  ASA: 2  Anesthesia Plan: General   Post-op Pain Management:    Induction: Intravenous  PONV Risk Score and Plan: 2 and Ondansetron, Dexamethasone, Treatment may vary due to age or medical condition and Midazolam  Airway Management Planned: Oral ETT  Additional Equipment: None  Intra-op Plan:   Post-operative Plan: Extubation in OR  Informed Consent: I have reviewed the patients History and Physical, chart, labs and discussed the procedure including the risks, benefits and alternatives for the proposed anesthesia with the patient or authorized representative who has indicated his/her understanding and acceptance.     Dental advisory given  Plan Discussed with: CRNA  Anesthesia Plan Comments:        Anesthesia Quick Evaluation

## 2020-08-26 NOTE — Anesthesia Procedure Notes (Addendum)
Procedure Name: Intubation Date/Time: 08/26/2020 12:26 PM Performed by: Barrington Ellison, CRNA Pre-anesthesia Checklist: Patient identified, Emergency Drugs available, Suction available and Patient being monitored Patient Re-evaluated:Patient Re-evaluated prior to induction Oxygen Delivery Method: Circle System Utilized Preoxygenation: Pre-oxygenation with 100% oxygen Induction Type: IV induction Ventilation: Mask ventilation without difficulty and Oral airway inserted - appropriate to patient size Laryngoscope Size: Mac and 4 Grade View: Grade I Tube type: Oral Tube size: 7.5 mm Number of attempts: 1 Airway Equipment and Method: Stylet and Oral airway Placement Confirmation: ETT inserted through vocal cords under direct vision, positive ETCO2 and breath sounds checked- equal and bilateral Secured at: 23 cm Tube secured with: Tape Dental Injury: Teeth and Oropharynx as per pre-operative assessment

## 2020-08-26 NOTE — Discharge Instructions (Addendum)
POSTOPERATIVE INSTRUCTIONS  Your doctor has performed vitreoretinal surgery on you at [place].  - Keep eye patched and shielded until seen by Dr. Zamora 8 AM tomorrow in clinic - Do not use drops until return - FACE DOWN POSITIONING WHILE AWAKE - Sleep with belly down or on right side, avoid laying flat on back.    - No strenuous bending, stooping or lifting.  - You may not drive until further notice.  - If your doctor used a gas bubble in your eye during the procedure he will advise you on postoperative positioning. If you have a gas bubble you will be wearing a green bracelet that was applied in the operating room. The green bracelet should stay on as long as the gas bubble is in your eye. While the gas bubble is present you should not fly in an airplane. If you require general anesthesia while the gas bubble is present you must notify your anesthesiologist that an intraocular gas bubble is present so he can take the appropriate precautions.  - Tylenol or any other over-the-counter pain reliever can be used according to your doctor. If more pain medicine is required, your doctor will have a prescription for you.  - You may read, go up and down stairs, and watch television.     Brian Zamora, M.D., Ph.D.  

## 2020-08-26 NOTE — Interval H&P Note (Signed)
History and Physical Interval Note:  08/26/2020 11:40 AM  Parker Hall  has presented today for surgery, with the diagnosis of retinal detachment, left eye.  The various methods of treatment have been discussed with the patient and family. After consideration of risks, benefits and other options for treatment, the patient has consented to  Procedure(s): [name] (Left) as a surgical intervention.  The patient's history has been reviewed, patient examined, no change in status, stable for surgery.  I have reviewed the patient's chart and labs.  Questions were answered to the patient's satisfaction.     Bernarda Caffey

## 2020-08-26 NOTE — Brief Op Note (Signed)
08/26/2020  4:14 PM  PATIENT:  Parker Hall  65 y.o. male  PRE-OPERATIVE DIAGNOSIS:  retinal detachment, left eye  POST-OPERATIVE DIAGNOSIS:  retinal detachment, left eye  PROCEDURE:  Procedure(s): REPAIR OF COMPLEX TRACTION RETINAL DETACHMENT (Left) VITRECTOMY 25 GAUGE WITH SCLERAL BUCKLE (Left) LASER PHOTO ABLATION (Left) PERFLUORONE INJECTION (Left) GAS/FLUID EXCHANGE (Left) INSERTION OF GAS (Left)  SURGEON:  Surgeon(s) and Role:    Bernarda Caffey, MD - Primary  ASSISTANTS: Ernest Mallick, Ophthalmic Assistant    ANESTHESIA:   local and general  EBL:  <5cc  BLOOD ADMINISTERED:none  DRAINS: none   LOCAL MEDICATIONS USED:  BUPIVICAINE , LIDOCAINE , and Amount: 9 ml  SPECIMEN:  No Specimen  DISPOSITION OF SPECIMEN:  N/A  COUNTS:  YES  TOURNIQUET:  * No tourniquets in log *  DICTATION: .Note written in EPIC  PLAN OF CARE: Discharge to home after PACU  PATIENT DISPOSITION:  PACU - hemodynamically stable.   Delay start of Pharmacological VTE agent (>24hrs) due to surgical blood loss or risk of bleeding: not applicable

## 2020-08-27 ENCOUNTER — Encounter (INDEPENDENT_AMBULATORY_CARE_PROVIDER_SITE_OTHER): Payer: Self-pay | Admitting: Ophthalmology

## 2020-08-27 ENCOUNTER — Ambulatory Visit (INDEPENDENT_AMBULATORY_CARE_PROVIDER_SITE_OTHER): Payer: BC Managed Care – PPO | Admitting: Ophthalmology

## 2020-08-27 DIAGNOSIS — I10 Essential (primary) hypertension: Secondary | ICD-10-CM

## 2020-08-27 DIAGNOSIS — H3322 Serous retinal detachment, left eye: Secondary | ICD-10-CM

## 2020-08-27 DIAGNOSIS — Z961 Presence of intraocular lens: Secondary | ICD-10-CM

## 2020-08-27 DIAGNOSIS — H3581 Retinal edema: Secondary | ICD-10-CM

## 2020-08-27 DIAGNOSIS — H35033 Hypertensive retinopathy, bilateral: Secondary | ICD-10-CM

## 2020-08-27 NOTE — Progress Notes (Signed)
[facility] Note  08/27/2020     CHIEF COMPLAINT Patient presents for Post-op Follow-up   HISTORY OF PRESENT ILLNESS: Parker Hall is a 65 y.o. male who presents to the clinic today for:   HPI     Post-op Follow-up   In left eye.  Discomfort includes Negative for pain, itching, foreign body sensation, tearing, discharge, floaters and none.  Vision is blurred at distance and is blurred at near.  I, the attending physician,  performed the HPI with the patient and updated documentation appropriately.        Comments   One day s/p RD repair OS--patient states no eye pain.      Last edited by Bernarda Caffey, MD on 09/01/2020 11:44 AM.     Patient  states last night was "rough", he is using 525m Tylenol for pain  Referring physician: No referring provider defined for this encounter.  HISTORICAL INFORMATION:   Selected notes from the MEDICAL RECORD NUMBER Referred by Dr. MZigmund DanielOcular Hx-Total Retinal Detachment OS    CURRENT MEDICATIONS: No current outpatient medications on file. (Ophthalmic Drugs)   No current facility-administered medications for this visit. (Ophthalmic Drugs)   Current Outpatient Medications (Other)  Medication Sig   Cholecalciferol (VITAMIN D-3) 125 MCG (5000 UT) TABS Take 5,000 Units by mouth at bedtime.   doxazosin (CARDURA) 2 MG tablet Take 2 mg by mouth at bedtime.   ezetimibe (ZETIA) 10 MG tablet Take 10 mg by mouth at bedtime.   fluticasone (FLONASE) 50 MCG/ACT nasal spray Place 2 sprays into both nostrils daily as needed for allergies or rhinitis.   HYDROcodone-acetaminophen (NORCO/VICODIN) 5-325 MG tablet Take 1 tablet by mouth every 4 (four) hours as needed for moderate pain.   Multiple Vitamins-Minerals (MULTIVITAMIN WITH MINERALS) tablet Take 1 tablet by mouth at bedtime.   No current facility-administered medications for this visit. (Other)   REVIEW OF SYSTEMS: ROS   Negative for: Constitutional,  Gastrointestinal, Neurological, Skin, Genitourinary, Musculoskeletal, HENT, Endocrine, Cardiovascular, Eyes, Respiratory, Psychiatric, Allergic/Imm, Heme/Lymph Last edited by BRoselee NovaD, COT on 08/27/2020  7:51 AM.      ALLERGIES No Known Allergies  PAST MEDICAL HISTORY Past Medical History:  Diagnosis Date   HLD (hyperlipidemia)    Hypertension    Hypertensive retinopathy    Past Surgical History:  Procedure Laterality Date   CATARACT EXTRACTION     EYE SURGERY     GAS INSERTION Left 08/26/2020   Procedure: INSERTION OF GAS;  Surgeon: ZBernarda Caffey MD;  [facility]  Service: Ophthalmology;  Laterality: Left;   GAS/FLUID EXCHANGE Left 08/26/2020   Procedure: GAS/FLUID EXCHANGE;  Surgeon: ZBernarda Caffey MD;  [facility]  Service: Ophthalmology;  Laterality: Left;   HERNIA REPAIR     LASER PHOTO ABLATION Left 08/26/2020   Procedure: LASER PHOTO ABLATION;  Surgeon: ZBernarda Caffey MD;  [facility]  Service: Ophthalmology;  Laterality: Left;   PERFLUORONE INJECTION Left 08/26/2020   Procedure: PERFLUORONE INJECTION;  Surgeon: ZBernarda Caffey MD;  [facility]  Service: Ophthalmology;  Laterality: Left;   REPAIR OF COMPLEX TRACTION RETINAL DETACHMENT Left 08/26/2020   Procedure: REPAIR OF COMPLEX TRACTION RETINAL DETACHMENT;  Surgeon: ZBernarda Caffey MD;  [place] [facility]  Service: Ophthalmology;  Laterality: Left;   skull/head surgery     VITRECTOMY 25 GAUGE WITH SCLERAL BUCKLE Left 08/26/2020   Procedure: VITRECTOMY 25 GAUGE WITH SCLERAL BUCKLE;  Surgeon: ZBernarda Caffey MD;  [place] [facility]  Service: Ophthalmology;  Laterality: Left;   YAG LASER APPLICATION      FAMILY HISTORY Family History  Problem Relation Age of Onset   Hypertension Mother     SOCIAL HISTORY Social History   Tobacco Use   Smoking status: Every Day    Packs/day: 1.50    Years: 42.00    Pack years: 63.00    Types: Cigarettes   Smokeless tobacco: Current    Types: Snuff  Vaping Use    Vaping Use: Never used  Substance Use Topics   Alcohol use: Yes    Alcohol/week: 14.0 standard drinks    Types: 14 Standard drinks or equivalent per week   Drug use: Yes    Types: Marijuana    Comment: last use 08/15/20         OPHTHALMIC EXAM:  Base Eye Exam     Visual Acuity (Snellen - Linear)       Right Left   Dist [place]  CF at 3'   Dist ph [place]  NI         Tonometry (Tonopen, 7:55 AM)       Right Left   Pressure  17         Pupils       Dark Light Shape React APD   Right 3 2 Round Brisk None   Left 5 5 Round None None         Neuro/Psych     Oriented x3: Yes   Mood/Affect: Normal         Dilation     Left eye: 1.0% Mydriacyl, 2.5% Phenylephrine @ 7:55 AM           Slit Lamp and Fundus Exam     External Exam       Right Left   External Normal Normal         Slit Lamp Exam       Right Left   Lids/Lashes Dermatochalasis - upper lid, Meibomian gland dysfunction, Telangiectasia Dermatochalasis - upper lid, Meibomian gland dysfunction, Telangiectasia   Conjunctiva/Sclera White and quiet Subconjunctival hemorrhage, sutures intact   Cornea arcus, trace PEE, well healed cataract wound arcus, epi defect   Anterior Chamber Deep and quiet Deep and quiet   Iris Round and dilated Round and dilated   Lens PC IOL in good position with open PC PC IOL in good position with open PC   Vitreous Vitreous syneresis, Posterior vitreous detachment, vitreous condensations post vitrectomy, good gas fill         Fundus Exam       Right Left   Disc  perfused   C/D Ratio  0.6   Macula  attached   Vessels  attenuated, Tortuous   Periphery  Retina attached over buckle, good buckle height, good laser over buckle and around breaks, PRE: OP: bullous total RD with +corrugations and 2 HST at 1000 and 1100            IMAGING AND PROCEDURES  Imaging and Procedures for 08/27/2020          ASSESSMENT/PLAN:    ICD-10-CM   1. Left retinal detachment   H33.22     2. Retinal edema  H35.81     3. Essential hypertension  I10     4. Hypertensive retinopathy of both eyes  H35.033     5. Pseudophakia, both eyes  Z96.1        1,2. Rhegmatogenous retinal detachment, OS - bullous total mac  off detachment, pt reports decreased vision for 5-6 wks - two horseshoe tears at 1000 and 1100 - POD1 s/p PPV/PFC/EL/FAX/14% C3F8 OD, 08.24.2022             - doing well this morning             - retina attached and in good position -- good buckle height and laser around breaks             - IOP good at 17             - start   PF 4x/day OD                          zymaxid QID OD                          Atropine BID OD                          Brimonidine BID OD                          PSO ung QID OD              - cont face down positioning x3 days; avoid laying flat on back              - eye shield when sleeping              - post op drop and positioning instructions reviewed              - tylenol/ibuprofen for pain              - Rx given for breakthrough pain  - f/u next Thursday or Friday for POW1 visit   3,4. Hypertensive retinopathy OU - discussed importance of tight BP control - monitor  5. Pseudophakia OU  - s/p CE/IOL OU (Dr. Manuella Ghazi)  - IOL in good position, doing well  - monitor  Ophthalmic Meds Ordered this visit:  No orders of the defined types were placed in this encounter.    Return for f/u next Thursday or Friday.  There are no Patient Instructions on file for this visit.   Explained the diagnoses, plan, and follow up with the patient and they expressed understanding.  Patient expressed understanding of the importance of proper follow up care.  This document serves as a record of services personally performed by Gardiner Sleeper, MD, PhD. It was created on their behalf by Leonie Douglas, an ophthalmic technician. The creation of this record is the provider's dictation and/or activities during the visit.     Electronically signed by: Leonie Douglas COA, 09/01/20  11:47 AM  This document serves as a record of services personally performed by Gardiner Sleeper, MD, PhD. It was created on their behalf by San Jetty. Owens Shark, OA an ophthalmic technician. The creation of this record is the provider's dictation and/or activities during the visit.    Electronically signed by: San Jetty. Owens Shark, [place] 08.26.2022 11:47 AM  Gardiner Sleeper, M.D., Ph.D. Diseases & Surgery of the Retina and Vitreous [facility]  I have reviewed the above documentation for accuracy and completeness, and I agree with the above. Gardiner Sleeper, M.D., Ph.D. 09/01/20 11:47 AM   Abbreviations: M myopia (nearsighted); A astigmatism; H  hyperopia (farsighted); P presbyopia; Mrx spectacle prescription;  CTL contact lenses; OD right eye; OS left eye; OU both eyes  XT exotropia; ET esotropia; PEK punctate epithelial keratitis; PEE punctate epithelial erosions; DES dry eye syndrome; MGD meibomian gland dysfunction; ATs artificial tears; PFAT's preservative free artificial tears; [place] nuclear sclerotic cataract; PSC posterior subcapsular cataract; ERM epi-retinal membrane; PVD posterior vitreous detachment; RD retinal detachment; DM diabetes mellitus; DR diabetic retinopathy; NPDR non-proliferative diabetic retinopathy; PDR proliferative diabetic retinopathy; CSME clinically significant macular edema; DME diabetic macular edema; dbh dot blot hemorrhages; CWS cotton wool spot; POAG primary open angle glaucoma; C/D cup-to-disc ratio; HVF humphrey visual field; GVF goldmann visual field; OCT optical coherence tomography; IOP intraocular pressure; BRVO Branch retinal vein occlusion; CRVO central retinal vein occlusion; CRAO central retinal artery occlusion; BRAO branch retinal artery occlusion; RT retinal tear; SB scleral buckle; PPV pars plana vitrectomy; VH Vitreous hemorrhage; PRP panretinal laser photocoagulation; IVK intravitreal  kenalog; VMT vitreomacular traction; MH Macular hole;  NVD neovascularization of the disc; NVE neovascularization elsewhere; AREDS age related eye disease study; ARMD age related macular degeneration; POAG primary open angle glaucoma; EBMD epithelial/anterior basement membrane dystrophy; ACIOL anterior chamber intraocular lens; IOL intraocular lens; PCIOL posterior chamber intraocular lens; Phaco/IOL phacoemulsification with intraocular lens placement; [place] photorefractive keratectomy; LASIK laser assisted in situ keratomileusis; HTN hypertension; DM diabetes mellitus; COPD chronic obstructive pulmonary disease

## 2020-08-27 NOTE — Anesthesia Postprocedure Evaluation (Signed)
Anesthesia Post Note  Patient: Parker Hall  Procedure(s) Performed: REPAIR OF COMPLEX TRACTION RETINAL DETACHMENT (Left: Eye) VITRECTOMY 25 GAUGE WITH SCLERAL BUCKLE (Left: Eye) LASER PHOTO ABLATION (Left: Eye) PERFLUORONE INJECTION (Left: Eye) GAS/FLUID EXCHANGE (Left: Eye) INSERTION OF GAS (Left: Eye)     Patient location during evaluation: PACU Anesthesia Type: General Level of consciousness: sedated and patient cooperative Pain management: pain level controlled Vital Signs Assessment: post-procedure vital signs reviewed and stable Respiratory status: spontaneous breathing Cardiovascular status: stable Anesthetic complications: no   No notable events documented.  Last Vitals:  Vitals:   08/26/20 1618 08/26/20 1632  BP: 114/71 113/78  Pulse: 79 76  Resp: 20 18  Temp:  37.5 C  SpO2: 91% 93%    Last Pain:  Vitals:   08/26/20 1632  TempSrc:   PainSc: 0-No pain                 Nolon Nations

## 2020-09-01 ENCOUNTER — Encounter (INDEPENDENT_AMBULATORY_CARE_PROVIDER_SITE_OTHER): Payer: Self-pay | Admitting: Ophthalmology

## 2020-09-01 NOTE — Progress Notes (Signed)
[facility] Note  09/03/2020     CHIEF COMPLAINT Patient presents for Retina Follow Up   HISTORY OF PRESENT ILLNESS: Parker Hall is a 65 y.o. male who presents to the clinic today for:   HPI     Retina Follow Up   Patient presents with  Retinal Break/Detachment.  In left eye.  This started 1 week ago.  I, the attending physician,  performed the HPI with the patient and updated documentation appropriately.        Comments   Patient here for 1 week retina follow up for RD OS. Patient states vision can see a little bit in AM till uses drops. On occasion has eye pain. Used drops this am.       Last edited by Bernarda Caffey, MD on 09/04/2020  1:52 PM.    Pt states last wk was "long."  Accidentally slept on back a couple times.  OS itches at times.  Minor pain OS in the mornings and evenings, but eases w/otc pain meds.  Referring physician: Hayden Pedro, MD [place],  [place] [address]  HISTORICAL INFORMATION:   Selected notes from the MEDICAL RECORD NUMBER Referred by Dr. Zigmund Daniel Ocular Hx-Total Retinal Detachment OS   CURRENT MEDICATIONS: Current Outpatient Medications (Ophthalmic Drugs)  Medication Sig   bacitracin-polymyxin b (POLYSPORIN) ophthalmic ointment Place into the left eye 4 (four) times daily for 10 days. Place a 1/2 inch ribbon of ointment into the lower eyelid.   ofloxacin (OCUFLOX) 0.3 % ophthalmic solution Place 1 drop into the left eye 4 (four) times daily for 10 days.   prednisoLONE acetate (PRED FORTE) 1 % ophthalmic suspension Place 1 drop into the left eye 4 (four) times daily.   No current facility-administered medications for this visit. (Ophthalmic Drugs)   Current Outpatient Medications (Other)  Medication Sig   Cholecalciferol (VITAMIN D-3) 125 MCG (5000 UT) TABS Take 5,000 Units by mouth at bedtime.   doxazosin (CARDURA) 2 MG tablet Take 2 mg by mouth at bedtime.   ezetimibe (ZETIA) 10 MG  tablet Take 10 mg by mouth at bedtime.   fluticasone (FLONASE) 50 MCG/ACT nasal spray Place 2 sprays into both nostrils daily as needed for allergies or rhinitis.   HYDROcodone-acetaminophen (NORCO/VICODIN) 5-325 MG tablet Take 1 tablet by mouth every 4 (four) hours as needed for moderate pain.   Multiple Vitamins-Minerals (MULTIVITAMIN WITH MINERALS) tablet Take 1 tablet by mouth at bedtime.   No current facility-administered medications for this visit. (Other)   REVIEW OF SYSTEMS: ROS   Positive for: Eyes Negative for: Constitutional, Gastrointestinal, Neurological, Skin, Genitourinary, Musculoskeletal, HENT, Endocrine, Cardiovascular, Respiratory, Psychiatric, Allergic/Imm, Heme/Lymph Last edited by Theodore Demark, COA on 09/03/2020  9:25 AM.     ALLERGIES No Known Allergies  PAST MEDICAL HISTORY Past Medical History:  Diagnosis Date   HLD (hyperlipidemia)    Hypertension    Hypertensive retinopathy    Past Surgical History:  Procedure Laterality Date   CATARACT EXTRACTION     EYE SURGERY     GAS INSERTION Left 08/26/2020   Procedure: INSERTION OF GAS;  Surgeon: Bernarda Caffey, MD;  Location: [facility];  Service: Ophthalmology;  Laterality: Left;   GAS/FLUID EXCHANGE Left 08/26/2020   Procedure: GAS/FLUID EXCHANGE;  Surgeon: Bernarda Caffey, MD;  Location: [facility];  Service: Ophthalmology;  Laterality: Left;   HERNIA REPAIR     LASER PHOTO ABLATION Left 08/26/2020   Procedure: LASER PHOTO  ABLATION;  Surgeon: Bernarda Caffey, MD;  Location: [facility];  Service: Ophthalmology;  Laterality: Left;   PERFLUORONE INJECTION Left 08/26/2020   Procedure: PERFLUORONE INJECTION;  Surgeon: Bernarda Caffey, MD;  Location: [facility];  Service: Ophthalmology;  Laterality: Left;   REPAIR OF COMPLEX TRACTION RETINAL DETACHMENT Left 08/26/2020   Procedure: REPAIR OF COMPLEX TRACTION RETINAL DETACHMENT;  Surgeon: Bernarda Caffey, MD;  Location: [facility];  Service: Ophthalmology;  Laterality: Left;   skull/head surgery      VITRECTOMY 25 GAUGE WITH SCLERAL BUCKLE Left 08/26/2020   Procedure: VITRECTOMY 25 GAUGE WITH SCLERAL BUCKLE;  Surgeon: Bernarda Caffey, MD;  Location: [facility];  Service: Ophthalmology;  Laterality: Left;   YAG LASER APPLICATION      FAMILY HISTORY Family History  Problem Relation Age of Onset   Hypertension Mother     SOCIAL HISTORY Social History   Tobacco Use   Smoking status: Every Day    Packs/day: 1.50    Years: 42.00    Pack years: 63.00    Types: Cigarettes   Smokeless tobacco: Current    Types: Snuff  Vaping Use   Vaping Use: Never used  Substance Use Topics   Alcohol use: Yes    Alcohol/week: 14.0 standard drinks    Types: 14 Standard drinks or equivalent per week   Drug use: Yes    Types: Marijuana    Comment: last use 08/15/20         OPHTHALMIC EXAM:  Base Eye Exam     Visual Acuity (Snellen - Linear)       Right Left   Dist [place] 20/20 CF at 3'         Tonometry (Tonopen, 9:21 AM)       Right Left   Pressure def 08         Pupils       Dark Light Shape React APD   Right 3 2 Round Brisk None   Left 5 5 Round None None         Visual Fields       Left Right     Full   Restrictions Total superior nasal, inferior nasal deficiencies; Partial outer superior temporal deficiency          Neuro/Psych     Oriented x3: Yes   Mood/Affect: Normal         Dilation     Left eye: 1.0% Mydriacyl, 2.5% Phenylephrine @ 9:21 AM           Slit Lamp and Fundus Exam     External Exam       Right Left   External Normal Normal         Slit Lamp Exam       Right Left   Lids/Lashes Dermatochalasis - upper lid, Meibomian gland dysfunction, Telangiectasia Dermatochalasis - upper lid, Meibomian gland dysfunction, Telangiectasia   Conjunctiva/Sclera White and quiet Subconjunctival hemorrhage, sutures intact   Cornea arcus, trace PEE, well healed cataract wound arcus, residual epi defect superiorly w/rolled edges, 4.89m in diameter, 3-4+  PEE   Anterior Chamber Deep and quiet Deep and quiet, narrow angles   Iris Round and dilated Round and mod dilated   Lens PC IOL in good position with open PC PC IOL in good position with open PC   Vitreous Vitreous syneresis, Posterior vitreous detachment, vitreous condensations post vitrectomy, good gas fill         Fundus Exam  Right Left   Disc  perfused   C/D Ratio  0.6   Macula  attached   Vessels  attenuated, Tortuous   Periphery  Retina attached over buckle, good buckle height, good laser over buckle and around breaks, PRE: OP: bullous total RD with +corrugations and 2 HST at 1000 and 1100            IMAGING AND PROCEDURES  Imaging and Procedures for 09/03/2020          ASSESSMENT/PLAN:    ICD-10-CM   1. Left retinal detachment  H33.22     2. Retinal edema  H35.81     3. Essential hypertension  I10     4. Hypertensive retinopathy of both eyes  H35.033     5. Pseudophakia, both eyes  Z96.1     1,2. Rhegmatogenous retinal detachment, OS - bullous total mac off detachment, pt reports decreased vision for 5-6 wks - two horseshoe tears at 1000 and 1100 - POW1 s/p PPV/PFC/EL/FAX/14% C3F8 OD, 08.24.2022             - doing well             - retina attached and in good position -- good buckle height and laser around breaks             - IOP good at 8  - residual epi defect w/ rolled edges -- 4 mm diam             - PF 4x/day OS                Zymaxid QID OS                Atropine BID OS - stop when bottle runs out                Stop Brimonidine                PSO ung QID OS             - avoid laying flat on back              - post op drop and positioning instructions reviewed              - tylenol/ibuprofen for pain              - Rx given for breakthrough pain  - f/u next Tuesday for epi defect / K check   3,4. Hypertensive retinopathy OU - discussed importance of tight BP control - monitor  5. Pseudophakia OU  - s/p CE/IOL OU (Dr.  Manuella Ghazi)  - IOL in good position, doing well  - monitor  Ophthalmic Meds Ordered this visit:  Meds ordered this encounter  Medications   prednisoLONE acetate (PRED FORTE) 1 % ophthalmic suspension    Sig: Place 1 drop into the left eye 4 (four) times daily.    Dispense:  15 mL    Refill:  0   ofloxacin (OCUFLOX) 0.3 % ophthalmic solution    Sig: Place 1 drop into the left eye 4 (four) times daily for 10 days.    Dispense:  10 mL    Refill:  0   bacitracin-polymyxin b (POLYSPORIN) ophthalmic ointment    Sig: Place into the left eye 4 (four) times daily for 10 days. Place a 1/2 inch ribbon of ointment into the lower eyelid.    Dispense:  3.5 g    Refill:  3      Return in about 4 days (around 09/07/2020) for F/u Tuesday, 9.6.22 for epi defect OS.  There are no Patient Instructions on file for this visit.   Explained the diagnoses, plan, and follow up with the patient and they expressed understanding.  Patient expressed understanding of the importance of proper follow up care.  This document serves as a record of services personally performed by Gardiner Sleeper, MD, PhD. It was created on their behalf by San Jetty. Owens Shark, OA an ophthalmic technician. The creation of this record is the provider's dictation and/or activities during the visit.    Electronically signed by: San Jetty. Owens Shark, [place] 09.01.2022 1:57 PM  This document serves as a record of services personally performed by Gardiner Sleeper, MD, PhD. It was created on their behalf by Estill Bakes, COT an ophthalmic technician. The creation of this record is the provider's dictation and/or activities during the visit.    Electronically signed by: Estill Bakes, COT 9.2.22 @ 1:57 PM   Gardiner Sleeper, M.D., Ph.D. Diseases & Surgery of the Retina and [place] 9.2.22  I have reviewed the above documentation for accuracy and completeness, and I agree with the above. Gardiner Sleeper, M.D., Ph.D.  09/04/20 1:57 PM   Abbreviations: M myopia (nearsighted); A astigmatism; H hyperopia (farsighted); P presbyopia; Mrx spectacle prescription;  CTL contact lenses; OD right eye; OS left eye; OU both eyes  XT exotropia; ET esotropia; PEK punctate epithelial keratitis; PEE punctate epithelial erosions; DES dry eye syndrome; MGD meibomian gland dysfunction; ATs artificial tears; PFAT's preservative free artificial tears; [place] nuclear sclerotic cataract; PSC posterior subcapsular cataract; ERM epi-retinal membrane; PVD posterior vitreous detachment; RD retinal detachment; DM diabetes mellitus; DR diabetic retinopathy; NPDR non-proliferative diabetic retinopathy; PDR proliferative diabetic retinopathy; CSME clinically significant macular edema; DME diabetic macular edema; dbh dot blot hemorrhages; CWS cotton wool spot; POAG primary open angle glaucoma; C/D cup-to-disc ratio; HVF humphrey visual field; GVF goldmann visual field; OCT optical coherence tomography; IOP intraocular pressure; BRVO Branch retinal vein occlusion; CRVO central retinal vein occlusion; CRAO central retinal artery occlusion; BRAO branch retinal artery occlusion; RT retinal tear; SB scleral buckle; PPV pars plana vitrectomy; VH Vitreous hemorrhage; PRP panretinal laser photocoagulation; IVK intravitreal kenalog; VMT vitreomacular traction; MH Macular hole;  NVD neovascularization of the disc; NVE neovascularization elsewhere; AREDS age related eye disease study; ARMD age related macular degeneration; POAG primary open angle glaucoma; EBMD epithelial/anterior basement membrane dystrophy; ACIOL anterior chamber intraocular lens; IOL intraocular lens; PCIOL posterior chamber intraocular lens; Phaco/IOL phacoemulsification with intraocular lens placement; [place] photorefractive keratectomy; LASIK laser assisted in situ keratomileusis; HTN hypertension; DM diabetes mellitus; COPD chronic obstructive pulmonary disease

## 2020-09-03 ENCOUNTER — Other Ambulatory Visit: Payer: Self-pay

## 2020-09-03 ENCOUNTER — Encounter (INDEPENDENT_AMBULATORY_CARE_PROVIDER_SITE_OTHER): Payer: Self-pay | Admitting: Ophthalmology

## 2020-09-03 ENCOUNTER — Ambulatory Visit (INDEPENDENT_AMBULATORY_CARE_PROVIDER_SITE_OTHER): Payer: BC Managed Care – PPO | Admitting: Ophthalmology

## 2020-09-03 DIAGNOSIS — H35033 Hypertensive retinopathy, bilateral: Secondary | ICD-10-CM

## 2020-09-03 DIAGNOSIS — H3322 Serous retinal detachment, left eye: Secondary | ICD-10-CM

## 2020-09-03 DIAGNOSIS — I10 Essential (primary) hypertension: Secondary | ICD-10-CM

## 2020-09-03 DIAGNOSIS — H3581 Retinal edema: Secondary | ICD-10-CM

## 2020-09-03 DIAGNOSIS — Z961 Presence of intraocular lens: Secondary | ICD-10-CM

## 2020-09-03 MED ORDER — OFLOXACIN 0.3 % OP SOLN: 1.0000 [drp] | Freq: Four times a day (QID) | OPHTHALMIC | 0 refills | Status: AC

## 2020-09-03 MED ORDER — BACITRACIN-POLYMYXIN B 500-10000 UNIT/GM OP OINT: TOPICAL_OINTMENT | Freq: Four times a day (QID) | OPHTHALMIC | 3 refills | Status: AC

## 2020-09-03 MED ORDER — PREDNISOLONE ACETATE 1 % OP SUSP: 1.0000 [drp] | Freq: Four times a day (QID) | OPHTHALMIC | 0 refills | Status: DC

## 2020-09-04 ENCOUNTER — Encounter (INDEPENDENT_AMBULATORY_CARE_PROVIDER_SITE_OTHER): Payer: Self-pay | Admitting: Ophthalmology

## 2020-09-07 ENCOUNTER — Ambulatory Visit (INDEPENDENT_AMBULATORY_CARE_PROVIDER_SITE_OTHER): Payer: BC Managed Care – PPO | Admitting: Ophthalmology

## 2020-09-07 ENCOUNTER — Encounter (INDEPENDENT_AMBULATORY_CARE_PROVIDER_SITE_OTHER): Payer: Self-pay | Admitting: Ophthalmology

## 2020-09-07 ENCOUNTER — Other Ambulatory Visit: Payer: Self-pay

## 2020-09-07 DIAGNOSIS — H3322 Serous retinal detachment, left eye: Secondary | ICD-10-CM

## 2020-09-07 DIAGNOSIS — H35033 Hypertensive retinopathy, bilateral: Secondary | ICD-10-CM

## 2020-09-07 DIAGNOSIS — I10 Essential (primary) hypertension: Secondary | ICD-10-CM

## 2020-09-07 DIAGNOSIS — Z961 Presence of intraocular lens: Secondary | ICD-10-CM

## 2020-09-07 DIAGNOSIS — H3581 Retinal edema: Secondary | ICD-10-CM

## 2020-09-07 NOTE — Progress Notes (Signed)
[facility] Note  09/07/2020     CHIEF COMPLAINT Patient presents for Retina Follow Up   HISTORY OF PRESENT ILLNESS: Parker Hall is a 65 y.o. male who presents to the clinic today for:   HPI     Retina Follow Up   Patient presents with  Retinal Break/Detachment.  In left eye.  Duration of 4 days.  Since onset it is stable.  I, the attending physician,  performed the HPI with the patient and updated documentation appropriately.        Comments   Pt here for 4 day p/o ret detachment OS. Pt states vision is starting to clear up a bit. Reports he can see his hands further from his face now. Reports doing all drops (Ofloxacin, PF and Bacitracin) QID OS.       Last edited by Bernarda Caffey, MD on 09/07/2020  8:54 AM.     Pt states .he is using all drops QID as directed  Referring physician: No referring provider defined for this encounter.  HISTORICAL INFORMATION:   Selected notes from the MEDICAL RECORD NUMBER Referred by Dr. Zigmund Daniel Ocular Hx-Total Retinal Detachment OS   CURRENT MEDICATIONS: Current Outpatient Medications (Ophthalmic Drugs)  Medication Sig   bacitracin-polymyxin b (POLYSPORIN) ophthalmic ointment Place into the left eye 4 (four) times daily for 10 days. Place a 1/2 inch ribbon of ointment into the lower eyelid.   ofloxacin (OCUFLOX) 0.3 % ophthalmic solution Place 1 drop into the left eye 4 (four) times daily for 10 days.   prednisoLONE acetate (PRED FORTE) 1 % ophthalmic suspension Place 1 drop into the left eye 4 (four) times daily.   No current facility-administered medications for this visit. (Ophthalmic Drugs)   Current Outpatient Medications (Other)  Medication Sig   Cholecalciferol (VITAMIN D-3) 125 MCG (5000 UT) TABS Take 5,000 Units by mouth at bedtime.   doxazosin (CARDURA) 2 MG tablet Take 2 mg by mouth at bedtime.   ezetimibe (ZETIA) 10 MG tablet Take 10 mg by mouth at bedtime.   fluticasone (FLONASE) 50  MCG/ACT nasal spray Place 2 sprays into both nostrils daily as needed for allergies or rhinitis.   HYDROcodone-acetaminophen (NORCO/VICODIN) 5-325 MG tablet Take 1 tablet by mouth every 4 (four) hours as needed for moderate pain.   Multiple Vitamins-Minerals (MULTIVITAMIN WITH MINERALS) tablet Take 1 tablet by mouth at bedtime.   No current facility-administered medications for this visit. (Other)   REVIEW OF SYSTEMS: ROS   Positive for: Eyes Negative for: Constitutional, Gastrointestinal, Neurological, Skin, Genitourinary, Musculoskeletal, HENT, Endocrine, Cardiovascular, Respiratory, Psychiatric, Allergic/Imm, Heme/Lymph Last edited by Kingsley Spittle, COT on 09/07/2020  8:26 AM.      ALLERGIES No Known Allergies  PAST MEDICAL HISTORY Past Medical History:  Diagnosis Date   HLD (hyperlipidemia)    Hypertension    Hypertensive retinopathy    Past Surgical History:  Procedure Laterality Date   CATARACT EXTRACTION     EYE SURGERY     GAS INSERTION Left 08/26/2020   Procedure: INSERTION OF GAS;  Surgeon: Bernarda Caffey, MD;  Location: [facility];  Service: Ophthalmology;  Laterality: Left;   GAS/FLUID EXCHANGE Left 08/26/2020   Procedure: GAS/FLUID EXCHANGE;  Surgeon: Bernarda Caffey, MD;  Location: [place] [facility];  Service: Ophthalmology;  Laterality: Left;   HERNIA REPAIR     LASER PHOTO ABLATION Left 08/26/2020   Procedure: LASER PHOTO ABLATION;  Surgeon: Bernarda Caffey, MD;  Location: [facility];  Service: Ophthalmology;  Laterality:  Left;   PERFLUORONE INJECTION Left 08/26/2020   Procedure: PERFLUORONE INJECTION;  Surgeon: Bernarda Caffey, MD;  Location: [facility];  Service: Ophthalmology;  Laterality: Left;   REPAIR OF COMPLEX TRACTION RETINAL DETACHMENT Left 08/26/2020   Procedure: REPAIR OF COMPLEX TRACTION RETINAL DETACHMENT;  Surgeon: Bernarda Caffey, MD;  Location: [facility];  Service: Ophthalmology;  Laterality: Left;   skull/head surgery     VITRECTOMY 25 GAUGE WITH SCLERAL BUCKLE Left 08/26/2020    Procedure: VITRECTOMY 25 GAUGE WITH SCLERAL BUCKLE;  Surgeon: Bernarda Caffey, MD;  Location: [facility];  Service: Ophthalmology;  Laterality: Left;   YAG LASER APPLICATION      FAMILY HISTORY Family History  Problem Relation Age of Onset   Hypertension Mother     SOCIAL HISTORY Social History   Tobacco Use   Smoking status: Every Day    Packs/day: 1.50    Years: 42.00    Pack years: 63.00    Types: Cigarettes   Smokeless tobacco: Current    Types: Snuff  Vaping Use   Vaping Use: Never used  Substance Use Topics   Alcohol use: Yes    Alcohol/week: 14.0 standard drinks    Types: 14 Standard drinks or equivalent per week   Drug use: Yes    Types: Marijuana    Comment: last use 08/15/20         OPHTHALMIC EXAM:  Base Eye Exam     Visual Acuity (Snellen - Linear)       Right Left   Dist [place] def CF at 3'    Correction: Glasses         Tonometry (Tonopen, 8:36 AM)       Right Left   Pressure def 10         Pupils       Dark Light Shape React APD   Right 3 2 Round Brisk None   Left 4 4 Round Minimal None         Visual Fields       Left Right     Full   Restrictions Total superior nasal, inferior nasal deficiencies; Partial outer superior temporal deficiency          Extraocular Movement       Right Left    Full, Ortho Full, Ortho         Neuro/Psych     Oriented x3: Yes   Mood/Affect: Normal         Dilation     Left eye: 1.0% Mydriacyl, 2.5% Phenylephrine @ 8:35 AM           Slit Lamp and Fundus Exam     External Exam       Right Left   External Normal Normal         Slit Lamp Exam       Right Left   Lids/Lashes Dermatochalasis - upper lid, Meibomian gland dysfunction, Telangiectasia Dermatochalasis - upper lid, Meibomian gland dysfunction, Telangiectasia   Conjunctiva/Sclera White and quiet Subconjunctival hemorrhage, sutures intact   Cornea arcus, trace PEE, well healed cataract wound arcus, residual epi defect  superiorly w/rolled edges (2.75Vx3.25H), 3-4+ PEE   Anterior Chamber Deep and quiet Deep and quiet, narrow angles   Iris Round and dilated Round and mod dilated   Lens PC IOL in good position with open PC PC IOL in good position with open PC   Vitreous Vitreous syneresis, Posterior vitreous detachment, vitreous condensations post vitrectomy, good gas fill  Fundus Exam       Right Left   Disc  perfused   C/D Ratio  0.6   Macula  attached   Vessels  attenuated, Tortuous   Periphery  Retina attached over buckle, good buckle height, good laser over buckle and around breaks, PRE: OP: bullous total RD with +corrugations and 2 HST at 1000 and 1100            IMAGING AND PROCEDURES  Imaging and Procedures for 09/07/2020          ASSESSMENT/PLAN:    ICD-10-CM   1. Left retinal detachment  H33.22     2. Retinal edema  H35.81     3. Essential hypertension  I10     4. Hypertensive retinopathy of both eyes  H35.033     5. Pseudophakia, both eyes  Z96.1     1,2. Rhegmatogenous retinal detachment, OS - bullous total mac off detachment, pt reports decreased vision for 5-6 wks - two horseshoe tears at 1000 and 1100 - POW1 s/p PPV/PFC/EL/FAX/14% C3F8 OD, 08.25.2022             - doing well             - retina attached and in good position -- good buckle height and laser around breaks             - IOP good at 10  - residual epi defect w/ rolled edges -- improving / closing -- now 2.75Vx3.25H             - continue PF 4x/day OS                Zymaxid QID OS                PSO ung QID OS             - avoid laying flat on back              - post op drop and positioning instructions reviewed              - tylenol/ibuprofen for pain              - Rx given for breakthrough pain  - f/u Friday for epi defect / K check   3,4. Hypertensive retinopathy OU - discussed importance of tight BP control - monitor  5. Pseudophakia OU  - s/p CE/IOL OU (Dr. Manuella Ghazi)  - IOL in  good position, doing well  - monitor  Ophthalmic Meds Ordered this visit:  No orders of the defined types were placed in this encounter.     Return for Friday at 8am, K check OS, DFE.  There are no Patient Instructions on file for this visit.   Explained the diagnoses, plan, and follow up with the patient and they expressed understanding.  Patient expressed understanding of the importance of proper follow up care.  This document serves as a record of services personally performed by Gardiner Sleeper, MD, PhD. It was created on their behalf by San Jetty. Owens Shark, OA an ophthalmic technician. The creation of this record is the provider's dictation and/or activities during the visit.    Electronically signed by: San Jetty. Owens Shark, [place] 09.06.2022 8:54 AM   Gardiner Sleeper, M.D., Ph.D. Diseases & Surgery of the Retina and Vitreous [facility]  I have reviewed the above documentation for accuracy and completeness, and I agree with the above.  Gardiner Sleeper, M.D., Ph.D. 09/07/20 8:55 AM  Abbreviations: M myopia (nearsighted); A astigmatism; H hyperopia (farsighted); P presbyopia; Mrx spectacle prescription;  CTL contact lenses; OD right eye; OS left eye; OU both eyes  XT exotropia; ET esotropia; PEK punctate epithelial keratitis; PEE punctate epithelial erosions; DES dry eye syndrome; MGD meibomian gland dysfunction; ATs artificial tears; PFAT's preservative free artificial tears; [place] nuclear sclerotic cataract; PSC posterior subcapsular cataract; ERM epi-retinal membrane; PVD posterior vitreous detachment; RD retinal detachment; DM diabetes mellitus; DR diabetic retinopathy; NPDR non-proliferative diabetic retinopathy; PDR proliferative diabetic retinopathy; CSME clinically significant macular edema; DME diabetic macular edema; dbh dot blot hemorrhages; CWS cotton wool spot; POAG primary open angle glaucoma; C/D cup-to-disc ratio; HVF humphrey visual field; GVF goldmann visual  field; OCT optical coherence tomography; IOP intraocular pressure; BRVO Branch retinal vein occlusion; CRVO central retinal vein occlusion; CRAO central retinal artery occlusion; BRAO branch retinal artery occlusion; RT retinal tear; SB scleral buckle; PPV pars plana vitrectomy; VH Vitreous hemorrhage; PRP panretinal laser photocoagulation; IVK intravitreal kenalog; VMT vitreomacular traction; MH Macular hole;  NVD neovascularization of the disc; NVE neovascularization elsewhere; AREDS age related eye disease study; ARMD age related macular degeneration; POAG primary open angle glaucoma; EBMD epithelial/anterior basement membrane dystrophy; ACIOL anterior chamber intraocular lens; IOL intraocular lens; PCIOL posterior chamber intraocular lens; Phaco/IOL phacoemulsification with intraocular lens placement; [place] photorefractive keratectomy; LASIK laser assisted in situ keratomileusis; HTN hypertension; DM diabetes mellitus; COPD chronic obstructive pulmonary disease

## 2020-09-09 NOTE — Progress Notes (Signed)
[facility] Note  09/10/2020     CHIEF COMPLAINT Patient presents for Post-op Follow-up   HISTORY OF PRESENT ILLNESS: Parker Hall is a 65 y.o. male who presents to the clinic today for:   HPI     Post-op Follow-up   In left eye.  Discomfort includes none.  Vision is improved, is blurred at distance and is blurred at near.  I, the attending physician,  performed the HPI with the patient and updated documentation appropriately.        Comments   65 y/o male pt here for 3 day POV and K ck OS.  S/p PPV OS 8.25.22.  VA OS seems to gradually improve each day.  No change in [place] OD.  Denies pain, FOL, floaters.  PF QID OS Zymaxid QID OS PSO ung QID OS      Last edited by Bernarda Caffey, MD on 09/11/2020  1:19 AM.      Referring physician: No referring provider defined for this encounter.  HISTORICAL INFORMATION:   Selected notes from the MEDICAL RECORD NUMBER Referred by Dr. Zigmund Daniel Ocular Hx-Total Retinal Detachment OS   CURRENT MEDICATIONS: Current Outpatient Medications (Ophthalmic Drugs)  Medication Sig   bacitracin-polymyxin b (POLYSPORIN) ophthalmic ointment Place into the left eye 4 (four) times daily for 10 days. Place a 1/2 inch ribbon of ointment into the lower eyelid.   ofloxacin (OCUFLOX) 0.3 % ophthalmic solution Place 1 drop into the left eye 4 (four) times daily for 10 days.   prednisoLONE acetate (PRED FORTE) 1 % ophthalmic suspension Place 1 drop into the left eye 4 (four) times daily.   No current facility-administered medications for this visit. (Ophthalmic Drugs)   Current Outpatient Medications (Other)  Medication Sig   Cholecalciferol (VITAMIN D-3) 125 MCG (5000 UT) TABS Take 5,000 Units by mouth at bedtime.   doxazosin (CARDURA) 2 MG tablet Take 2 mg by mouth at bedtime.   ezetimibe (ZETIA) 10 MG tablet Take 10 mg by mouth at bedtime.   fluticasone (FLONASE) 50 MCG/ACT nasal spray Place 2 sprays into both nostrils  daily as needed for allergies or rhinitis.   HYDROcodone-acetaminophen (NORCO/VICODIN) 5-325 MG tablet Take 1 tablet by mouth every 4 (four) hours as needed for moderate pain.   Multiple Vitamins-Minerals (MULTIVITAMIN WITH MINERALS) tablet Take 1 tablet by mouth at bedtime.   No current facility-administered medications for this visit. (Other)   REVIEW OF SYSTEMS: ROS   Positive for: Eyes Negative for: Constitutional, Gastrointestinal, Neurological, Skin, Genitourinary, Musculoskeletal, HENT, Endocrine, Cardiovascular, Respiratory, Psychiatric, Allergic/Imm, Heme/Lymph Last edited by Matthew Folks, COA on 09/10/2020  8:06 AM.       ALLERGIES No Known Allergies  PAST MEDICAL HISTORY Past Medical History:  Diagnosis Date   HLD (hyperlipidemia)    Hypertension    Hypertensive retinopathy    Retinal detachment    Past Surgical History:  Procedure Laterality Date   CATARACT EXTRACTION     EYE SURGERY     GAS INSERTION Left 08/26/2020   Procedure: INSERTION OF GAS;  Surgeon: Bernarda Caffey, MD;  Location: [place];  Service: Ophthalmology;  Laterality: Left;   GAS/FLUID EXCHANGE Left 08/26/2020   Procedure: GAS/FLUID EXCHANGE;  Surgeon: Bernarda Caffey, MD;  Location: [place];  Service: Ophthalmology;  Laterality: Left;   HERNIA REPAIR     LASER PHOTO ABLATION Left 08/26/2020   Procedure: LASER PHOTO ABLATION;  Surgeon: Bernarda Caffey, MD;  Location: [place];  Service: Ophthalmology;  Laterality: Left;   PERFLUORONE INJECTION Left 08/26/2020   Procedure: PERFLUORONE INJECTION;  Surgeon: Bernarda Caffey, MD;  Location: [place];  Service: Ophthalmology;  Laterality: Left;   REPAIR OF COMPLEX TRACTION RETINAL DETACHMENT Left 08/26/2020   Procedure: REPAIR OF COMPLEX TRACTION RETINAL DETACHMENT;  Surgeon: Bernarda Caffey, MD;  Location: [place];  Service: Ophthalmology;  Laterality: Left;   RETINAL DETACHMENT SURGERY     skull/head surgery     VITRECTOMY 25 GAUGE WITH SCLERAL BUCKLE Left 08/26/2020    Procedure: VITRECTOMY 25 GAUGE WITH SCLERAL BUCKLE;  Surgeon: Bernarda Caffey, MD;  Location: [place];  Service: Ophthalmology;  Laterality: Left;   YAG LASER APPLICATION      FAMILY HISTORY Family History  Problem Relation Age of Onset   Hypertension Mother     SOCIAL HISTORY Social History   Tobacco Use   Smoking status: Every Day    Packs/day: 1.50    Years: 42.00    Pack years: 63.00    Types: Cigarettes   Smokeless tobacco: Current    Types: Snuff  Vaping Use   Vaping Use: Never used  Substance Use Topics   Alcohol use: Yes    Alcohol/week: 14.0 standard drinks    Types: 14 Standard drinks or equivalent per week   Drug use: Yes    Types: Marijuana    Comment: last use 08/15/20         OPHTHALMIC EXAM:  Base Eye Exam     Visual Acuity (Snellen - Linear)       Right Left   Dist [place] 20/20 CF @ 3'   Dist ph [place]  NI         Tonometry (Tonopen, 8:09 AM)       Right Left   Pressure Def 9         Pupils       Dark Light Shape React APD   Right 3 2 Round Brisk None   Left 4 4 Round Minimal None         Visual Fields       Left Right     Full   Restrictions Total superior nasal, inferior nasal deficiencies; Partial outer superior temporal deficiency          Extraocular Movement       Right Left    Full, Ortho Full, Ortho         Neuro/Psych     Oriented x3: Yes   Mood/Affect: Normal         Dilation     Left eye: 1.0% Mydriacyl, 2.5% Phenylephrine @ 8:09 AM           Slit Lamp and Fundus Exam     External Exam       Right Left   External Normal Normal         Slit Lamp Exam       Right Left   Lids/Lashes Dermatochalasis - upper lid, Meibomian gland dysfunction, Telangiectasia Dermatochalasis - upper lid, Meibomian gland dysfunction, Telangiectasia   Conjunctiva/Sclera White and quiet Subconjunctival hemorrhage, sutures intact   Cornea arcus, trace PEE, well healed cataract wound arcus, residual, triangular  punctate epi defect superior paracentral  (<46m), 3+ PEE   Anterior Chamber Deep and quiet Deep and quiet, narrow angles   Iris Round and dilated Round and mod dilated   Lens PC IOL in good position with open PC PC IOL in good position with open PC   Vitreous Vitreous syneresis, Posterior  vitreous detachment, vitreous condensations post vitrectomy, good gas fill         Fundus Exam       Right Left   Disc  perfused   C/D Ratio  0.6   Macula  attached   Vessels  attenuated, Tortuous   Periphery  Retina attached over buckle, good buckle height, good laser over buckle and around breaks, PRE: OP: bullous total RD with +corrugations and 2 HST at 1000 and 1100            IMAGING AND PROCEDURES  Imaging and Procedures for 09/10/2020          ASSESSMENT/PLAN:    ICD-10-CM   1. Left retinal detachment  H33.22     2. Retinal edema  H35.81     3. Essential hypertension  I10     4. Hypertensive retinopathy of both eyes  H35.033     5. Pseudophakia, both eyes  Z96.1      1,2. Rhegmatogenous retinal detachment, OS - bullous total mac off detachment, pt reports decreased vision for 5-6 wks - two horseshoe tears at 1000 and 1100 - POW2 s/p SBP + PPV/PFC/EL/FAX/14% C3F8 OS, 08.25.2022             - doing well             - retina attached and in good position -- good buckle height and laser around breaks             - IOP good at 09  - residual epi defect w/ rolled edges -- improving / closing -- now less than 10m             - cont  PF 4x/day OS                 Zymaxid QID OS                 PSO ung QID OS             - avoid laying flat on back              - post op drop and positioning instructions reviewed             - tylenol/ibuprofen for pain              - Rx given for breakthrough pain  - f/u 1 wks for epi defect / K check   3,4. Hypertensive retinopathy OU - discussed importance of tight BP control - monitor   5. Pseudophakia OU  - s/p CE/IOL OU (Dr.  SManuella Ghazi  - IOL in good position, doing well  - monitor   Ophthalmic Meds Ordered this visit:  No orders of the defined types were placed in this encounter.     Return for next Weds, K check OS.  There are no Patient Instructions on file for this visit.   Explained the diagnoses, plan, and follow up with the patient and they expressed understanding.  Patient expressed understanding of the importance of proper follow up care.  This document serves as a record of services personally performed by BGardiner Sleeper MD, PhD. It was created on their behalf by RLeonie Douglas an ophthalmic technician. The creation of this record is the provider's dictation and/or activities during the visit.    Electronically signed by: RLeonie DouglasCOA, 09/11/20  1:21 AM  BGardiner Sleeper M.D., Ph.D. Diseases & Surgery of the Retina and Vitreous [facility] 09/10/2020  I have reviewed the above documentation for accuracy and completeness, and I agree with the above. Gardiner Sleeper, M.D., Ph.D. 09/11/20 1:24 AM  Abbreviations: M myopia (nearsighted); A astigmatism; H hyperopia (farsighted); P presbyopia; Mrx spectacle prescription;  CTL contact lenses; OD right eye; OS left eye; OU both eyes  XT exotropia; ET esotropia; PEK punctate epithelial keratitis; PEE punctate epithelial erosions; DES dry eye syndrome; MGD meibomian gland dysfunction; ATs artificial tears; PFAT's preservative free artificial tears; [place] nuclear sclerotic cataract; PSC posterior subcapsular cataract; ERM epi-retinal membrane; PVD posterior vitreous detachment; RD retinal detachment; DM diabetes mellitus; DR diabetic retinopathy; NPDR non-proliferative diabetic retinopathy; PDR proliferative diabetic retinopathy; CSME clinically significant macular edema; DME diabetic macular edema; dbh dot blot hemorrhages; CWS cotton wool spot; POAG primary open angle glaucoma; C/D cup-to-disc ratio; HVF humphrey visual field; GVF  goldmann visual field; OCT optical coherence tomography; IOP intraocular pressure; BRVO Branch retinal vein occlusion; CRVO central retinal vein occlusion; CRAO central retinal artery occlusion; BRAO branch retinal artery occlusion; RT retinal tear; SB scleral buckle; PPV pars plana vitrectomy; VH Vitreous hemorrhage; PRP panretinal laser photocoagulation; IVK intravitreal kenalog; VMT vitreomacular traction; MH Macular hole;  NVD neovascularization of the disc; NVE neovascularization elsewhere; AREDS age related eye disease study; ARMD age related macular degeneration; POAG primary open angle glaucoma; EBMD epithelial/anterior basement membrane dystrophy; ACIOL anterior chamber intraocular lens; IOL intraocular lens; PCIOL posterior chamber intraocular lens; Phaco/IOL phacoemulsification with intraocular lens placement; [place] photorefractive keratectomy; LASIK laser assisted in situ keratomileusis; HTN hypertension; DM diabetes mellitus; COPD chronic obstructive pulmonary disease

## 2020-09-10 ENCOUNTER — Ambulatory Visit (INDEPENDENT_AMBULATORY_CARE_PROVIDER_SITE_OTHER): Payer: BC Managed Care – PPO | Admitting: Ophthalmology

## 2020-09-10 ENCOUNTER — Encounter (INDEPENDENT_AMBULATORY_CARE_PROVIDER_SITE_OTHER): Payer: Self-pay | Admitting: Ophthalmology

## 2020-09-10 ENCOUNTER — Other Ambulatory Visit: Payer: Self-pay

## 2020-09-10 DIAGNOSIS — H35033 Hypertensive retinopathy, bilateral: Secondary | ICD-10-CM

## 2020-09-10 DIAGNOSIS — I10 Essential (primary) hypertension: Secondary | ICD-10-CM

## 2020-09-10 DIAGNOSIS — H3322 Serous retinal detachment, left eye: Secondary | ICD-10-CM

## 2020-09-10 DIAGNOSIS — Z961 Presence of intraocular lens: Secondary | ICD-10-CM

## 2020-09-10 DIAGNOSIS — H3581 Retinal edema: Secondary | ICD-10-CM

## 2020-09-11 ENCOUNTER — Encounter (INDEPENDENT_AMBULATORY_CARE_PROVIDER_SITE_OTHER): Payer: Self-pay | Admitting: Ophthalmology

## 2020-09-15 ENCOUNTER — Other Ambulatory Visit: Payer: Self-pay

## 2020-09-15 ENCOUNTER — Encounter (INDEPENDENT_AMBULATORY_CARE_PROVIDER_SITE_OTHER): Payer: Self-pay | Admitting: Ophthalmology

## 2020-09-15 ENCOUNTER — Ambulatory Visit (INDEPENDENT_AMBULATORY_CARE_PROVIDER_SITE_OTHER): Payer: BC Managed Care – PPO | Admitting: Ophthalmology

## 2020-09-15 DIAGNOSIS — H3322 Serous retinal detachment, left eye: Secondary | ICD-10-CM

## 2020-09-15 DIAGNOSIS — H3581 Retinal edema: Secondary | ICD-10-CM

## 2020-09-15 DIAGNOSIS — I10 Essential (primary) hypertension: Secondary | ICD-10-CM

## 2020-09-15 DIAGNOSIS — Z961 Presence of intraocular lens: Secondary | ICD-10-CM

## 2020-09-15 DIAGNOSIS — H35033 Hypertensive retinopathy, bilateral: Secondary | ICD-10-CM

## 2020-09-15 NOTE — Progress Notes (Signed)
[facility] Note  09/15/2020     CHIEF COMPLAINT Patient presents for Post-op Follow-up   HISTORY OF PRESENT ILLNESS: Parker Hall is a 65 y.o. male who presents to the clinic today for:   HPI     Post-op Follow-up   In left eye.  Discomfort includes itching.  I, the attending physician,  performed the HPI with the patient and updated documentation appropriately.        Comments   3 week post op RD repair OS- Doing well.  Eye itches at times.  He can focus on large objects but cannot bring it in clear.  Using Prednisolone, Ofloxacin, and PSO ung QID OS.       Last edited by Bernarda Caffey, MD on 09/15/2020  8:32 AM.      Referring physician: No referring provider defined for this encounter.  HISTORICAL INFORMATION:   Selected notes from the MEDICAL RECORD NUMBER Referred by Dr. Zigmund Daniel Ocular Hx-Total Retinal Detachment OS   CURRENT MEDICATIONS: Current Outpatient Medications (Ophthalmic Drugs)  Medication Sig   prednisoLONE acetate (PRED FORTE) 1 % ophthalmic suspension Place 1 drop into the left eye 4 (four) times daily.   No current facility-administered medications for this visit. (Ophthalmic Drugs)   Current Outpatient Medications (Other)  Medication Sig   Cholecalciferol (VITAMIN D-3) 125 MCG (5000 UT) TABS Take 5,000 Units by mouth at bedtime.   doxazosin (CARDURA) 2 MG tablet Take 2 mg by mouth at bedtime.   ezetimibe (ZETIA) 10 MG tablet Take 10 mg by mouth at bedtime.   fluticasone (FLONASE) 50 MCG/ACT nasal spray Place 2 sprays into both nostrils daily as needed for allergies or rhinitis.   HYDROcodone-acetaminophen (NORCO/VICODIN) 5-325 MG tablet Take 1 tablet by mouth every 4 (four) hours as needed for moderate pain.   Multiple Vitamins-Minerals (MULTIVITAMIN WITH MINERALS) tablet Take 1 tablet by mouth at bedtime.   No current facility-administered medications for this visit. (Other)   REVIEW OF SYSTEMS: ROS    Positive for: Eyes Negative for: Constitutional, Gastrointestinal, Neurological, Skin, Genitourinary, Musculoskeletal, HENT, Endocrine, Cardiovascular, Respiratory, Psychiatric, Allergic/Imm, Heme/Lymph Last edited by Leonie Douglas, COA on 09/15/2020  7:48 AM.     ALLERGIES No Known Allergies  PAST MEDICAL HISTORY Past Medical History:  Diagnosis Date   HLD (hyperlipidemia)    Hypertension    Hypertensive retinopathy    Retinal detachment    Past Surgical History:  Procedure Laterality Date   CATARACT EXTRACTION     EYE SURGERY     GAS INSERTION Left 08/26/2020   Procedure: INSERTION OF GAS;  Surgeon: Bernarda Caffey, MD;  Location: [place];  Service: Ophthalmology;  Laterality: Left;   GAS/FLUID EXCHANGE Left 08/26/2020   Procedure: GAS/FLUID EXCHANGE;  Surgeon: Bernarda Caffey, MD;  Location: [place];  Service: Ophthalmology;  Laterality: Left;   HERNIA REPAIR     LASER PHOTO ABLATION Left 08/26/2020   Procedure: LASER PHOTO ABLATION;  Surgeon: Bernarda Caffey, MD;  Location: [place];  Service: Ophthalmology;  Laterality: Left;   PERFLUORONE INJECTION Left 08/26/2020   Procedure: PERFLUORONE INJECTION;  Surgeon: Bernarda Caffey, MD;  Location: [place];  Service: Ophthalmology;  Laterality: Left;   REPAIR OF COMPLEX TRACTION RETINAL DETACHMENT Left 08/26/2020   Procedure: REPAIR OF COMPLEX TRACTION RETINAL DETACHMENT;  Surgeon: Bernarda Caffey, MD;  Location: [place];  Service: Ophthalmology;  Laterality: Left;   RETINAL DETACHMENT SURGERY     skull/head surgery     VITRECTOMY 25 GAUGE  WITH SCLERAL BUCKLE Left 08/26/2020   Procedure: VITRECTOMY 75 GAUGE WITH SCLERAL BUCKLE;  Surgeon: Bernarda Caffey, MD;  Location: [place];  Service: Ophthalmology;  Laterality: Left;   YAG LASER APPLICATION      FAMILY HISTORY Family History  Problem Relation Age of Onset   Hypertension Mother     SOCIAL HISTORY Social History   Tobacco Use   Smoking status: Every Day    Packs/day: 1.50    Years: 42.00     Pack years: 63.00    Types: Cigarettes   Smokeless tobacco: Current    Types: Snuff  Vaping Use   Vaping Use: Never used  Substance Use Topics   Alcohol use: Yes    Alcohol/week: 14.0 standard drinks    Types: 14 Standard drinks or equivalent per week   Drug use: Yes    Types: Marijuana    Comment: last use 08/15/20         OPHTHALMIC EXAM:  Base Eye Exam     Visual Acuity (Snellen - Linear)       Right Left   Dist [place] 20/20 -2 CF 4'   Dist ph [place]  NI         Tonometry (Tonopen, 7:57 AM)       Right Left   Pressure 12 13         Pupils       Dark Light Shape React APD   Right 2 1 Round Brisk None   Left 4 4 Round NR          Visual Fields (Counting fingers)       Left Right     Full   Restrictions Total superior nasal deficiency          Neuro/Psych     Oriented x3: Yes   Mood/Affect: Normal         Dilation     Left eye: 1.0% Mydriacyl, 2.5% Phenylephrine @ 7:57 AM           Slit Lamp and Fundus Exam     External Exam       Right Left   External Normal Normal         Slit Lamp Exam       Right Left   Lids/Lashes Dermatochalasis - upper lid, Meibomian gland dysfunction, Telangiectasia Dermatochalasis - upper lid, Meibomian gland dysfunction, Telangiectasia   Conjunctiva/Sclera White and quiet Subconjunctival hemorrhage, sutures intact   Cornea arcus, trace PEE, well healed cataract wound arcus, epi defect closed, 3-4+ Punctate epithelial erosions   Anterior Chamber Deep and quiet Deep and quiet, narrow angles   Iris Round and dilated Round and mod dilated   Lens PC IOL in good position with open PC PC IOL in good position with open PC   Vitreous Vitreous syneresis, Posterior vitreous detachment, vitreous condensations post vitrectomy, ~80% gas bubble         Fundus Exam       Right Left   Disc  perfused   C/D Ratio  0.6   Macula  flat under gas   Vessels  attenuated, Tortuous   Periphery  Retina attached over  buckle, good buckle height, good laser over buckle and around breaks, PRE: OP: bullous total RD with +corrugations and 2 HST at 1000 and 1100            IMAGING AND PROCEDURES  Imaging and Procedures for 09/15/2020          ASSESSMENT/PLAN:  ICD-10-CM   1. Left retinal detachment  H33.22     2. Retinal edema  H35.81     3. Essential hypertension  I10     4. Hypertensive retinopathy of both eyes  H35.033     5. Pseudophakia, both eyes  Z96.1      1,2. Rhegmatogenous retinal detachment, OS - bullous total mac off detachment, pt reports decreased vision for 5-6 wks - two horseshoe tears at 1000 and 1100 - POW3 s/p SBP + PPV/PFC/EL/FAX/14% C3F8 OS, 08.25.2022             - doing well             - retina attached and in good position -- good buckle height and laser around breaks             - IOP good at 13  - residual epi defect w/ rolled edges -- closed today             - cont  PF 4x/day OS                 Zymaxid QID OS -- okay to stop                 PSO ung QID OS             - avoid laying flat on back              - post op drop and positioning instructions reviewed             - tylenol/ibuprofen for pain              - Rx given for breakthrough pain  - f/u 2 wks for POV OS   3,4. Hypertensive retinopathy OU - discussed importance of tight BP control - monitor   5. Pseudophakia OU  - s/p CE/IOL OU (Dr. Manuella Ghazi)  - IOL in good position, doing well  - monitor   Ophthalmic Meds Ordered this visit:  No orders of the defined types were placed in this encounter.     Return in about 2 weeks (around 09/29/2020) for RD OS, DFE, OCT.  There are no Patient Instructions on file for this visit.   Explained the diagnoses, plan, and follow up with the patient and they expressed understanding.  Patient expressed understanding of the importance of proper follow up care.  This document serves as a record of services personally performed by Gardiner Sleeper, MD, PhD.  It was created on their behalf by San Jetty. Owens Shark, OA an ophthalmic technician. The creation of this record is the provider's dictation and/or activities during the visit.    Electronically signed by: San Jetty. Owens Shark, [place] 09.14.2022 8:32 AM   Gardiner Sleeper, M.D., Ph.D. Diseases & Surgery of the Retina and Vitreous [facility]  I have reviewed the above documentation for accuracy and completeness, and I agree with the above. Gardiner Sleeper, M.D., Ph.D. 09/15/20 8:32 AM  Abbreviations: M myopia (nearsighted); A astigmatism; H hyperopia (farsighted); P presbyopia; Mrx spectacle prescription;  CTL contact lenses; OD right eye; OS left eye; OU both eyes  XT exotropia; ET esotropia; PEK punctate epithelial keratitis; PEE punctate epithelial erosions; DES dry eye syndrome; MGD meibomian gland dysfunction; ATs artificial tears; PFAT's preservative free artificial tears; [place] nuclear sclerotic cataract; PSC posterior subcapsular cataract; ERM epi-retinal membrane; PVD posterior vitreous detachment; RD retinal detachment; DM diabetes mellitus; DR diabetic retinopathy;  NPDR non-proliferative diabetic retinopathy; PDR proliferative diabetic retinopathy; CSME clinically significant macular edema; DME diabetic macular edema; dbh dot blot hemorrhages; CWS cotton wool spot; POAG primary open angle glaucoma; C/D cup-to-disc ratio; HVF humphrey visual field; GVF goldmann visual field; OCT optical coherence tomography; IOP intraocular pressure; BRVO Branch retinal vein occlusion; CRVO central retinal vein occlusion; CRAO central retinal artery occlusion; BRAO branch retinal artery occlusion; RT retinal tear; SB scleral buckle; PPV pars plana vitrectomy; VH Vitreous hemorrhage; PRP panretinal laser photocoagulation; IVK intravitreal kenalog; VMT vitreomacular traction; MH Macular hole;  NVD neovascularization of the disc; NVE neovascularization elsewhere; AREDS age related eye disease study; ARMD age  related macular degeneration; POAG primary open angle glaucoma; EBMD epithelial/anterior basement membrane dystrophy; ACIOL anterior chamber intraocular lens; IOL intraocular lens; PCIOL posterior chamber intraocular lens; Phaco/IOL phacoemulsification with intraocular lens placement; [place] photorefractive keratectomy; LASIK laser assisted in situ keratomileusis; HTN hypertension; DM diabetes mellitus; COPD chronic obstructive pulmonary disease

## 2020-09-24 NOTE — Progress Notes (Signed)
[facility] Note  09/29/2020     CHIEF COMPLAINT Patient presents for Post-op Follow-up   HISTORY OF PRESENT ILLNESS: Parker Hall is a 65 y.o. male who presents to the clinic today for:   HPI     Post-op Follow-up   In left eye.        Comments   5 week post op RD repair OS- The top part of his vision is clear.  1/2 way is the bubble which has lightened up some. Prednisolone and PSO ung QID OS        Last edited by Leonie Douglas, COA on 09/29/2020  7:54 AM.    Pt states vision is starting to improve, pt is using PF and PSO Ung QID   Referring physician: No referring provider defined for this encounter.  HISTORICAL INFORMATION:   Selected notes from the MEDICAL RECORD NUMBER Referred by Dr. Zigmund Daniel Ocular Hx-Total Retinal Detachment OS   CURRENT MEDICATIONS: Current Outpatient Medications (Ophthalmic Drugs)  Medication Sig   prednisoLONE acetate (PRED FORTE) 1 % ophthalmic suspension Place 1 drop into the left eye 4 (four) times daily.   No current facility-administered medications for this visit. (Ophthalmic Drugs)   Current Outpatient Medications (Other)  Medication Sig   Cholecalciferol (VITAMIN D-3) 125 MCG (5000 UT) TABS Take 5,000 Units by mouth at bedtime.   doxazosin (CARDURA) 2 MG tablet Take 2 mg by mouth at bedtime.   ezetimibe (ZETIA) 10 MG tablet Take 10 mg by mouth at bedtime.   fluticasone (FLONASE) 50 MCG/ACT nasal spray Place 2 sprays into both nostrils daily as needed for allergies or rhinitis.   HYDROcodone-acetaminophen (NORCO/VICODIN) 5-325 MG tablet Take 1 tablet by mouth every 4 (four) hours as needed for moderate pain.   Multiple Vitamins-Minerals (MULTIVITAMIN WITH MINERALS) tablet Take 1 tablet by mouth at bedtime.   No current facility-administered medications for this visit. (Other)   REVIEW OF SYSTEMS: ROS   Positive for: Eyes Negative for: Constitutional, Gastrointestinal, Neurological, Skin,  Genitourinary, Musculoskeletal, HENT, Endocrine, Cardiovascular, Respiratory, Psychiatric, Allergic/Imm, Heme/Lymph Last edited by Leonie Douglas, COA on 09/29/2020  7:54 AM.     ALLERGIES No Known Allergies  PAST MEDICAL HISTORY Past Medical History:  Diagnosis Date   HLD (hyperlipidemia)    Hypertension    Hypertensive retinopathy    Retinal detachment    Past Surgical History:  Procedure Laterality Date   CATARACT EXTRACTION     EYE SURGERY     GAS INSERTION Left 08/26/2020   Procedure: INSERTION OF GAS;  Surgeon: Bernarda Caffey, MD;  Location: [place];  Service: Ophthalmology;  Laterality: Left;   GAS/FLUID EXCHANGE Left 08/26/2020   Procedure: GAS/FLUID EXCHANGE;  Surgeon: Bernarda Caffey, MD;  Location: [place];  Service: Ophthalmology;  Laterality: Left;   HERNIA REPAIR     LASER PHOTO ABLATION Left 08/26/2020   Procedure: LASER PHOTO ABLATION;  Surgeon: Bernarda Caffey, MD;  Location: [place];  Service: Ophthalmology;  Laterality: Left;   PERFLUORONE INJECTION Left 08/26/2020   Procedure: PERFLUORONE INJECTION;  Surgeon: Bernarda Caffey, MD;  Location: [place];  Service: Ophthalmology;  Laterality: Left;   REPAIR OF COMPLEX TRACTION RETINAL DETACHMENT Left 08/26/2020   Procedure: REPAIR OF COMPLEX TRACTION RETINAL DETACHMENT;  Surgeon: Bernarda Caffey, MD;  Location: [place];  Service: Ophthalmology;  Laterality: Left;   RETINAL DETACHMENT SURGERY     skull/head surgery     VITRECTOMY 25 GAUGE WITH SCLERAL BUCKLE Left 08/26/2020   Procedure:  VITRECTOMY 25 GAUGE WITH SCLERAL BUCKLE;  Surgeon: Bernarda Caffey, MD;  Location: [place];  Service: Ophthalmology;  Laterality: Left;   YAG LASER APPLICATION      FAMILY HISTORY Family History  Problem Relation Age of Onset   Hypertension Mother     SOCIAL HISTORY Social History   Tobacco Use   Smoking status: Every Day    Packs/day: 1.50    Years: 42.00    Pack years: 63.00    Types: Cigarettes   Smokeless tobacco: Current    Types: Snuff   Vaping Use   Vaping Use: Never used  Substance Use Topics   Alcohol use: Yes    Alcohol/week: 14.0 standard drinks    Types: 14 Standard drinks or equivalent per week   Drug use: Yes    Types: Marijuana    Comment: last use 08/15/20       OPHTHALMIC EXAM:  Base Eye Exam     Visual Acuity (Snellen - Linear)       Right Left   Dist [place] 20/25 +2 20/200 -1   Dist ph [place]  20/200 +1         Tonometry (Tonopen, 8:00 AM)       Right Left   Pressure 17 14         Pupils       Dark Light Shape React APD   Right 3 2 Round Brisk None   Left 4 4 Round NR          Visual Fields (Counting fingers)       Left Right     Full   Restrictions Total inferior temporal, inferior nasal deficiencies          Extraocular Movement       Right Left    Full Full         Neuro/Psych     Oriented x3: Yes   Mood/Affect: Normal         Dilation     Both eyes: 1.0% Mydriacyl, 2.5% Phenylephrine @ 8:00 AM           Slit Lamp and Fundus Exam     External Exam       Right Left   External Normal Normal         Slit Lamp Exam       Right Left   Lids/Lashes Dermatochalasis - upper lid, Meibomian gland dysfunction, Telangiectasia Dermatochalasis - upper lid, Meibomian gland dysfunction, Telangiectasia   Conjunctiva/Sclera White and quiet White and quiet, sutures intact   Cornea arcus, trace PEE, well healed cataract wound arcus, 3-4+ Punctate epithelial erosions, irregular epi surface, but no epi defect   Anterior Chamber Deep and quiet Deep and quiet, narrow angles   Iris Round and dilated Round and mod dilated   Lens PC IOL in good position with open PC PC IOL in good position with open PC   Vitreous Vitreous syneresis, Posterior vitreous detachment, vitreous condensations post vitrectomy, ~45% gas bubble         Fundus Exam       Right Left   Disc Pink and Sharp, +cupping, peripapillary nevus approx 0.7DD at 0600 disc mild Pallor, Sharp rim   C/D Ratio  0.7 0.6   Macula Flat, Blunted foveal reflex, RPE mottling, No heme or edema Flat/re-attached   Vessels attenuated, Tortuous, mild Copper wiring attenuated   Periphery Attached    Retina attached over buckle, good buckle height, good laser over buckle and  around breaks, PRE: OP: bullous total RD with +corrugations and 2 HST at 1000 and [place] and Procedures for 09/29/2020  OCT, Retina - OU - Both Eyes       Right Eye Quality was good. Central Foveal Thickness: 274. Progression has been stable. Findings include normal foveal contour, no IRF, no SRF.   Left Eye Central Foveal Thickness: 234. Progression has improved. Findings include normal foveal contour, no IRF, no SRF (Retina reattached; Superior macula obscured by gas bubble, patchy ellipsoid thinning).   Notes *Images captured and stored on drive  Diagnosis / Impression:  OD: NFP, no IRF/SRF OS: retina reattached; Superior macula obscured by gas bubble, patchy ellipsoid thinning  Clinical management:  See below  Abbreviations: NFP - Normal foveal profile. CME - cystoid macular edema. PED - pigment epithelial detachment. IRF - intraretinal fluid. SRF - subretinal fluid. EZ - ellipsoid zone. ERM - epiretinal membrane. ORA - outer retinal atrophy. ORT - outer retinal tubulation. SRHM - subretinal hyper-reflective material. IRHM - intraretinal hyper-reflective material            ASSESSMENT/PLAN:    ICD-10-CM   1. Left retinal detachment  H33.22     2. Retinal edema  H35.81 OCT, Retina - OU - Both Eyes    3. Essential hypertension  I10     4. Hypertensive retinopathy of both eyes  H35.033     5. Pseudophakia, both eyes  Z96.1       1,2. Rhegmatogenous retinal detachment, OS - bullous total mac off detachment, pt reports decreased vision for 5-6 wks - two horseshoe tears at 1000 and 1100 - POM1 s/p SBP + PPV/PFC/EL/FAX/14% C3F8 OS, 08.25.2022             - doing well              - retina attached and in good position -- good buckle height and laser around breaks             - IOP good at 14  - epi defect stably closed but still with 3-4+ PEE             - cont  PF 4x/day OS                 PSO ung QID OS -- okay to use at bedtime only  - add AT's QID OS              - avoid laying flat on back              - post op drop and positioning instructions reviewed             - Rx given for breakthrough pain  - f/u 2 wks for POV / K check OS   3,4. Hypertensive retinopathy OU - discussed importance of tight BP control - monitor   5. Pseudophakia OU  - s/p CE/IOL OU (Dr. Manuella Ghazi)  - IOL in good position, doing well  - monitor  Ophthalmic Meds Ordered this visit:  No orders of the defined types were placed in this encounter.     Return in about 2 weeks (around 10/13/2020) for f/u RD OS / K check OS.  There are no Patient Instructions on file for this visit.   This document serves as a record of services personally performed by Gardiner Sleeper, MD, PhD. It was  created on their behalf by Orvan Falconer, an ophthalmic technician. The creation of this record is the provider's dictation and/or activities during the visit.    Electronically signed by: Orvan Falconer, OA, 09/30/20  3:44 PM   This document serves as a record of services personally performed by Gardiner Sleeper, MD, PhD. It was created on their behalf by San Jetty. Owens Shark, OA an ophthalmic technician. The creation of this record is the provider's dictation and/or activities during the visit.    Electronically signed by: San Jetty. Owens Shark, [place] 09.28.2022 3:44 PM  Gardiner Sleeper, M.D., Ph.D. Diseases & Surgery of the Retina and Vitreous [facility]  I have reviewed the above documentation for accuracy and completeness, and I agree with the above. Gardiner Sleeper, M.D., Ph.D. 09/30/20 3:46 PM   Abbreviations: M myopia (nearsighted); A astigmatism; H hyperopia  (farsighted); P presbyopia; Mrx spectacle prescription;  CTL contact lenses; OD right eye; OS left eye; OU both eyes  XT exotropia; ET esotropia; PEK punctate epithelial keratitis; PEE punctate epithelial erosions; DES dry eye syndrome; MGD meibomian gland dysfunction; ATs artificial tears; PFAT's preservative free artificial tears; [place] nuclear sclerotic cataract; PSC posterior subcapsular cataract; ERM epi-retinal membrane; PVD posterior vitreous detachment; RD retinal detachment; DM diabetes mellitus; DR diabetic retinopathy; NPDR non-proliferative diabetic retinopathy; PDR proliferative diabetic retinopathy; CSME clinically significant macular edema; DME diabetic macular edema; dbh dot blot hemorrhages; CWS cotton wool spot; POAG primary open angle glaucoma; C/D cup-to-disc ratio; HVF humphrey visual field; GVF goldmann visual field; OCT optical coherence tomography; IOP intraocular pressure; BRVO Branch retinal vein occlusion; CRVO central retinal vein occlusion; CRAO central retinal artery occlusion; BRAO branch retinal artery occlusion; RT retinal tear; SB scleral buckle; PPV pars plana vitrectomy; VH Vitreous hemorrhage; PRP panretinal laser photocoagulation; IVK intravitreal kenalog; VMT vitreomacular traction; MH Macular hole;  NVD neovascularization of the disc; NVE neovascularization elsewhere; AREDS age related eye disease study; ARMD age related macular degeneration; POAG primary open angle glaucoma; EBMD epithelial/anterior basement membrane dystrophy; ACIOL anterior chamber intraocular lens; IOL intraocular lens; PCIOL posterior chamber intraocular lens; Phaco/IOL phacoemulsification with intraocular lens placement; [place] photorefractive keratectomy; LASIK laser assisted in situ keratomileusis; HTN hypertension; DM diabetes mellitus; COPD chronic obstructive pulmonary disease

## 2020-09-29 ENCOUNTER — Other Ambulatory Visit: Payer: Self-pay

## 2020-09-29 ENCOUNTER — Ambulatory Visit (INDEPENDENT_AMBULATORY_CARE_PROVIDER_SITE_OTHER): Payer: BC Managed Care – PPO | Admitting: Ophthalmology

## 2020-09-29 DIAGNOSIS — H35033 Hypertensive retinopathy, bilateral: Secondary | ICD-10-CM | POA: Diagnosis not present

## 2020-09-29 DIAGNOSIS — I10 Essential (primary) hypertension: Secondary | ICD-10-CM | POA: Diagnosis not present

## 2020-09-29 DIAGNOSIS — H3322 Serous retinal detachment, left eye: Secondary | ICD-10-CM | POA: Diagnosis not present

## 2020-09-29 DIAGNOSIS — Z961 Presence of intraocular lens: Secondary | ICD-10-CM

## 2020-09-29 DIAGNOSIS — H3581 Retinal edema: Secondary | ICD-10-CM

## 2020-09-30 ENCOUNTER — Encounter (INDEPENDENT_AMBULATORY_CARE_PROVIDER_SITE_OTHER): Payer: Self-pay | Admitting: Ophthalmology

## 2020-10-07 NOTE — Progress Notes (Signed)
[facility] Note  10/13/2020     CHIEF COMPLAINT Patient presents for Retina Follow Up   HISTORY OF PRESENT ILLNESS: Parker Hall is a 65 y.o. male who presents to the clinic today for:   HPI     Retina Follow Up   Patient presents with  Retinal Break/Detachment.  In left eye.  Severity is mild.  Duration of 2 weeks.  Since onset it is gradually improving.  I, the attending physician,  performed the HPI with the patient and updated documentation appropriately.        Comments   Pt here for 2 wk ret f/u RD OS/POV/Kcheck. Pt stats he fees vision is improving. No ocular pain or discomfort reported. Currently taking PF gtts QID OS, Refresh tears QID OU, Bacitracin ointment QD OS.       Last edited by Bernarda Caffey, MD on 10/16/2020 10:29 PM.    Pt states vision is continuing to improve, he is using PF and Refresh QID OS and PSO Ung at bedtime  Referring physician: No referring provider defined for this encounter.  HISTORICAL INFORMATION:  Selected notes from the MEDICAL RECORD NUMBER Referred by Dr. Zigmund Daniel Ocular Hx-Total Retinal Detachment OS   CURRENT MEDICATIONS: Current Outpatient Medications (Ophthalmic Drugs)  Medication Sig   prednisoLONE acetate (PRED FORTE) 1 % ophthalmic suspension Place 1 drop into the left eye 4 (four) times daily.   No current facility-administered medications for this visit. (Ophthalmic Drugs)   Current Outpatient Medications (Other)  Medication Sig   Cholecalciferol (VITAMIN D-3) 125 MCG (5000 UT) TABS Take 5,000 Units by mouth at bedtime.   doxazosin (CARDURA) 2 MG tablet Take 2 mg by mouth at bedtime.   ezetimibe (ZETIA) 10 MG tablet Take 10 mg by mouth at bedtime.   fluticasone (FLONASE) 50 MCG/ACT nasal spray Place 2 sprays into both nostrils daily as needed for allergies or rhinitis.   HYDROcodone-acetaminophen (NORCO/VICODIN) 5-325 MG tablet Take 1 tablet by mouth every 4 (four) hours as needed for  moderate pain.   Multiple Vitamins-Minerals (MULTIVITAMIN WITH MINERALS) tablet Take 1 tablet by mouth at bedtime.   No current facility-administered medications for this visit. (Other)   REVIEW OF SYSTEMS: ROS   Positive for: Eyes Negative for: Constitutional, Gastrointestinal, Neurological, Skin, Genitourinary, Musculoskeletal, HENT, Endocrine, Cardiovascular, Respiratory, Psychiatric, Allergic/Imm, Heme/Lymph Last edited by Kingsley Spittle, COT on 10/13/2020  8:13 AM.      ALLERGIES No Known Allergies  PAST MEDICAL HISTORY Past Medical History:  Diagnosis Date   HLD (hyperlipidemia)    Hypertension    Hypertensive retinopathy    Retinal detachment    Past Surgical History:  Procedure Laterality Date   CATARACT EXTRACTION     EYE SURGERY     GAS INSERTION Left 08/26/2020   Procedure: INSERTION OF GAS;  Surgeon: Bernarda Caffey, MD;  Location: [facility];  Service: Ophthalmology;  Laterality: Left;   GAS/FLUID EXCHANGE Left 08/26/2020   Procedure: GAS/FLUID EXCHANGE;  Surgeon: Bernarda Caffey, MD;  Location: [place];  Service: Ophthalmology;  Laterality: Left;   HERNIA REPAIR     LASER PHOTO ABLATION Left 08/26/2020   Procedure: LASER PHOTO ABLATION;  Surgeon: Bernarda Caffey, MD;  Location: [place];  Service: Ophthalmology;  Laterality: Left;   PERFLUORONE INJECTION Left 08/26/2020   Procedure: PERFLUORONE INJECTION;  Surgeon: Bernarda Caffey, MD;  Location: [facility];  Service: Ophthalmology;  Laterality: Left;   REPAIR OF COMPLEX TRACTION RETINAL DETACHMENT Left 08/26/2020  Procedure: REPAIR OF COMPLEX TRACTION RETINAL DETACHMENT;  Surgeon: Bernarda Caffey, MD;  Location: [facility];  Service: Ophthalmology;  Laterality: Left;   RETINAL DETACHMENT SURGERY     skull/head surgery     VITRECTOMY 25 GAUGE WITH SCLERAL BUCKLE Left 08/26/2020   Procedure: VITRECTOMY 25 GAUGE WITH SCLERAL BUCKLE;  Surgeon: Bernarda Caffey, MD;  Location: [facility];  Service: Ophthalmology;  Laterality: Left;   YAG LASER  APPLICATION      FAMILY HISTORY Family History  Problem Relation Age of Onset   Hypertension Mother     SOCIAL HISTORY Social History   Tobacco Use   Smoking status: Every Day    Packs/day: 1.50    Years: 42.00    Pack years: 63.00    Types: Cigarettes   Smokeless tobacco: Current    Types: Snuff  Vaping Use   Vaping Use: Never used  Substance Use Topics   Alcohol use: Yes    Alcohol/week: 14.0 standard drinks    Types: 14 Standard drinks or equivalent per week   Drug use: Yes    Types: Marijuana    Comment: last use 08/15/20       OPHTHALMIC EXAM:  Base Eye Exam     Visual Acuity (Snellen - Linear)       Right Left   Dist [place] 20/20 20/200   Dist ph [place]  20/40 -2         Tonometry (Tonopen, 8:20 AM)       Right Left   Pressure 14 13         Pupils       Dark Light Shape React APD   Right 3 2 Round Minimal None   Left 4 4 Round None None         Visual Fields (Counting fingers)       Left Right     Full   Restrictions Total inferior temporal, inferior nasal deficiencies          Extraocular Movement       Right Left    Full, Ortho Full, Ortho         Neuro/Psych     Oriented x3: Yes   Mood/Affect: Normal         Dilation     Both eyes: 1.0% Mydriacyl, 2.5% Phenylephrine @ 8:21 AM           Slit Lamp and Fundus Exam     External Exam       Right Left   External Normal Normal         Slit Lamp Exam       Right Left   Lids/Lashes Dermatochalasis - upper lid, Meibomian gland dysfunction, Telangiectasia Dermatochalasis - upper lid, Meibomian gland dysfunction, Telangiectasia   Conjunctiva/Sclera White and quiet White and quiet, sutures intact   Cornea arcus, trace PEE, well healed cataract wound arcus, 2-3+ Punctate epithelial erosions   Anterior Chamber Deep and quiet Deep and quiet, narrow angles   Iris Round and dilated Round and mod dilated   Lens PC IOL in good position with open PC PC IOL in good position  with open PC   Vitreous Vitreous syneresis, Posterior vitreous detachment, vitreous condensations post vitrectomy, ~30-35% gas bubble         Fundus Exam       Right Left   Disc Pink and Sharp, +cupping, peripapillary nevus approx 0.7DD at 0600 disc mild Pallor, Sharp rim, +cupping   C/D Ratio 0.7 0.6  Macula Flat, Blunted foveal reflex, RPE mottling, No heme or edema Flat, Good foveal reflex, No heme or edema   Vessels attenuated, Tortuous, mild Copper wiring attenuated, Tortuous   Periphery Attached    Retina attached over buckle, good buckle height, good laser over buckle and around breaks, focal pre-retinal heme at 0430 just posterior to buckle; PRE: OP: bullous total RD with +corrugations and 2 HST at 1000 and 1100            IMAGING AND PROCEDURES  Imaging and Procedures for 10/13/2020  OCT, Retina - OU - Both Eyes       Right Eye Quality was good. Central Foveal Thickness: 277. Progression has been stable. Findings include normal foveal contour, no IRF, no SRF.   Left Eye Quality was borderline. Central Foveal Thickness: 245. Progression has improved. Findings include normal foveal contour, no IRF, no SRF, outer retinal atrophy (Retina reattached, patchy ellipsoid thinning).   Notes *Images captured and stored on drive  Diagnosis / Impression:  OD: NFP, no IRF/SRF OS: retina reattached; patchy ellipsoid thinning  Clinical management:  See below  Abbreviations: NFP - Normal foveal profile. CME - cystoid macular edema. PED - pigment epithelial detachment. IRF - intraretinal fluid. SRF - subretinal fluid. EZ - ellipsoid zone. ERM - epiretinal membrane. ORA - outer retinal atrophy. ORT - outer retinal tubulation. SRHM - subretinal hyper-reflective material. IRHM - intraretinal hyper-reflective material            ASSESSMENT/PLAN:   ICD-10-CM   1. Left retinal detachment  H33.22     2. Retinal edema  H35.81 OCT, Retina - OU - Both Eyes    3. Essential  hypertension  I10     4. Hypertensive retinopathy of both eyes  H35.033     5. Pseudophakia, both eyes  Z96.1     1,2. Rhegmatogenous retinal detachment, OS - bullous total mac off detachment, pt reports decreased vision for 5-6 wks - two horseshoe tears at 1000 and 1100 - POM2 s/p SBP + PPV/PFC/EL/FAX/14% C3F8 OS, 08.25.2022             - doing well             - retina attached and in good position -- good buckle height and laser around breaks             - IOP good at 13  - epi defect stably closed but still with 2-3+ PEE  - gas bubble 30-35%             - cont  PF 4x/day OS -- decrease to TID                 PSO ung QID OS -- okay to stop -- switch to Refresh ointment  - cont AT's QID OS              - avoid laying flat on back              - post op drop and positioning instructions reviewed  - f/u 2 wks for POV   3,4. Hypertensive retinopathy OU - discussed importance of tight BP control - monitor   5. Pseudophakia OU  - s/p CE/IOL OU (Dr. Manuella Ghazi)  - IOL in good position, doing well  - monitor  Ophthalmic Meds Ordered this visit:  No orders of the defined types were placed in this encounter.     Return for f/u October 27 / 28, RD OS,  DFE, OCT.  There are no Patient Instructions on file for this visit.   This document serves as a record of services personally performed by Gardiner Sleeper, MD, PhD. It was created on their behalf by Orvan Falconer, an ophthalmic technician. The creation of this record is the provider's dictation and/or activities during the visit.    Electronically signed by: Orvan Falconer, OA, 10/16/20  10:33 PM   Gardiner Sleeper, M.D., Ph.D. Diseases & Surgery of the Retina and Vitreous [facility]  I have reviewed the above documentation for accuracy and completeness, and I agree with the above. Gardiner Sleeper, M.D., Ph.D. 10/16/20 10:33 PM   Abbreviations: M myopia (nearsighted); A astigmatism; H hyperopia  (farsighted); P presbyopia; Mrx spectacle prescription;  CTL contact lenses; OD right eye; OS left eye; OU both eyes  XT exotropia; ET esotropia; PEK punctate epithelial keratitis; PEE punctate epithelial erosions; DES dry eye syndrome; MGD meibomian gland dysfunction; ATs artificial tears; PFAT's preservative free artificial tears; [place] nuclear sclerotic cataract; PSC posterior subcapsular cataract; ERM epi-retinal membrane; PVD posterior vitreous detachment; RD retinal detachment; DM diabetes mellitus; DR diabetic retinopathy; NPDR non-proliferative diabetic retinopathy; PDR proliferative diabetic retinopathy; CSME clinically significant macular edema; DME diabetic macular edema; dbh dot blot hemorrhages; CWS cotton wool spot; POAG primary open angle glaucoma; C/D cup-to-disc ratio; HVF humphrey visual field; GVF goldmann visual field; OCT optical coherence tomography; IOP intraocular pressure; BRVO Branch retinal vein occlusion; CRVO central retinal vein occlusion; CRAO central retinal artery occlusion; BRAO branch retinal artery occlusion; RT retinal tear; SB scleral buckle; PPV pars plana vitrectomy; VH Vitreous hemorrhage; PRP panretinal laser photocoagulation; IVK intravitreal kenalog; VMT vitreomacular traction; MH Macular hole;  NVD neovascularization of the disc; NVE neovascularization elsewhere; AREDS age related eye disease study; ARMD age related macular degeneration; POAG primary open angle glaucoma; EBMD epithelial/anterior basement membrane dystrophy; ACIOL anterior chamber intraocular lens; IOL intraocular lens; PCIOL posterior chamber intraocular lens; Phaco/IOL phacoemulsification with intraocular lens placement; [place] photorefractive keratectomy; LASIK laser assisted in situ keratomileusis; HTN hypertension; DM diabetes mellitus; COPD chronic obstructive pulmonary disease

## 2020-10-11 DIAGNOSIS — Z1331 Encounter for screening for depression: Secondary | ICD-10-CM | POA: Diagnosis not present

## 2020-10-11 DIAGNOSIS — E782 Mixed hyperlipidemia: Secondary | ICD-10-CM | POA: Diagnosis not present

## 2020-10-11 DIAGNOSIS — I10 Essential (primary) hypertension: Secondary | ICD-10-CM | POA: Diagnosis not present

## 2020-10-11 DIAGNOSIS — Z125 Encounter for screening for malignant neoplasm of prostate: Secondary | ICD-10-CM | POA: Diagnosis not present

## 2020-10-11 DIAGNOSIS — E790 Hyperuricemia without signs of inflammatory arthritis and tophaceous disease: Secondary | ICD-10-CM | POA: Diagnosis not present

## 2020-10-11 DIAGNOSIS — R748 Abnormal levels of other serum enzymes: Secondary | ICD-10-CM | POA: Diagnosis not present

## 2020-10-11 DIAGNOSIS — R7303 Prediabetes: Secondary | ICD-10-CM | POA: Diagnosis not present

## 2020-10-11 DIAGNOSIS — Z23 Encounter for immunization: Secondary | ICD-10-CM | POA: Diagnosis not present

## 2020-10-13 ENCOUNTER — Other Ambulatory Visit: Payer: Self-pay

## 2020-10-13 ENCOUNTER — Encounter (INDEPENDENT_AMBULATORY_CARE_PROVIDER_SITE_OTHER): Payer: Self-pay | Admitting: Ophthalmology

## 2020-10-13 ENCOUNTER — Ambulatory Visit (INDEPENDENT_AMBULATORY_CARE_PROVIDER_SITE_OTHER): Payer: BC Managed Care – PPO | Admitting: Ophthalmology

## 2020-10-13 DIAGNOSIS — H35033 Hypertensive retinopathy, bilateral: Secondary | ICD-10-CM

## 2020-10-13 DIAGNOSIS — H3322 Serous retinal detachment, left eye: Secondary | ICD-10-CM | POA: Diagnosis not present

## 2020-10-13 DIAGNOSIS — Z961 Presence of intraocular lens: Secondary | ICD-10-CM

## 2020-10-13 DIAGNOSIS — I10 Essential (primary) hypertension: Secondary | ICD-10-CM

## 2020-10-13 DIAGNOSIS — H3581 Retinal edema: Secondary | ICD-10-CM

## 2020-10-16 ENCOUNTER — Encounter (INDEPENDENT_AMBULATORY_CARE_PROVIDER_SITE_OTHER): Payer: Self-pay | Admitting: Ophthalmology

## 2020-10-25 NOTE — Progress Notes (Signed)
[facility] Note  10/28/2020     CHIEF COMPLAINT Patient presents for Retina Follow Up   HISTORY OF PRESENT ILLNESS: Parker Hall is a 65 y.o. male who presents to the clinic today for:   HPI     Retina Follow Up   Patient presents with  Retinal Break/Detachment.  In left eye.  Severity is moderate.  Duration of 2 weeks.  Since onset it is stable.  I, the attending physician,  performed the HPI with the patient and updated documentation appropriately.        Comments   Patient states vision about the same OS. Still sees gas bubble OS, but appears to be getting smaller. Using Pred Forte qid OS, Refresh qid OS, and genteal ointment tid OS. No eye pain. Some itching OS.       Last edited by Bernarda Caffey, MD on 10/29/2020 12:47 PM.    Pt states gas bubble is very small and he can see through it   Referring physician: No referring provider defined for this encounter.  HISTORICAL INFORMATION:  Selected notes from the MEDICAL RECORD NUMBER Referred by Dr. Zigmund Daniel Ocular Hx-Total Retinal Detachment OS   CURRENT MEDICATIONS: Current Outpatient Medications (Ophthalmic Drugs)  Medication Sig   prednisoLONE acetate (PRED FORTE) 1 % ophthalmic suspension Place 1 drop into the left eye 4 (four) times daily.   No current facility-administered medications for this visit. (Ophthalmic Drugs)   Current Outpatient Medications (Other)  Medication Sig   Cholecalciferol (VITAMIN D-3) 125 MCG (5000 UT) TABS Take 5,000 Units by mouth at bedtime.   doxazosin (CARDURA) 2 MG tablet Take 2 mg by mouth at bedtime.   ezetimibe (ZETIA) 10 MG tablet Take 10 mg by mouth at bedtime.   fluticasone (FLONASE) 50 MCG/ACT nasal spray Place 2 sprays into both nostrils daily as needed for allergies or rhinitis.   HYDROcodone-acetaminophen (NORCO/VICODIN) 5-325 MG tablet Take 1 tablet by mouth every 4 (four) hours as needed for moderate pain.   Multiple Vitamins-Minerals  (MULTIVITAMIN WITH MINERALS) tablet Take 1 tablet by mouth at bedtime.   No current facility-administered medications for this visit. (Other)   REVIEW OF SYSTEMS: ROS   Positive for: Eyes Negative for: Constitutional, Gastrointestinal, Neurological, Skin, Genitourinary, Musculoskeletal, HENT, Endocrine, Cardiovascular, Respiratory, Psychiatric, Allergic/Imm, Heme/Lymph Last edited by Roselee Nova D, COT on 10/28/2020  8:00 AM.     ALLERGIES No Known Allergies  PAST MEDICAL HISTORY Past Medical History:  Diagnosis Date   HLD (hyperlipidemia)    Hypertension    Hypertensive retinopathy    Retinal detachment    Past Surgical History:  Procedure Laterality Date   CATARACT EXTRACTION     EYE SURGERY     GAS INSERTION Left 08/26/2020   Procedure: INSERTION OF GAS;  Surgeon: Bernarda Caffey, MD;  Location: [place];  Service: Ophthalmology;  Laterality: Left;   GAS/FLUID EXCHANGE Left 08/26/2020   Procedure: GAS/FLUID EXCHANGE;  Surgeon: Bernarda Caffey, MD;  Location: [place];  Service: Ophthalmology;  Laterality: Left;   HERNIA REPAIR     LASER PHOTO ABLATION Left 08/26/2020   Procedure: LASER PHOTO ABLATION;  Surgeon: Bernarda Caffey, MD;  Location: [place];  Service: Ophthalmology;  Laterality: Left;   PERFLUORONE INJECTION Left 08/26/2020   Procedure: PERFLUORONE INJECTION;  Surgeon: Bernarda Caffey, MD;  Location: [place];  Service: Ophthalmology;  Laterality: Left;   REPAIR OF COMPLEX TRACTION RETINAL DETACHMENT Left 08/26/2020   Procedure: REPAIR OF COMPLEX TRACTION RETINAL DETACHMENT;  Surgeon: Bernarda Caffey, MD;  Location: [place];  Service: Ophthalmology;  Laterality: Left;   RETINAL DETACHMENT SURGERY     skull/head surgery     VITRECTOMY 25 GAUGE WITH SCLERAL BUCKLE Left 08/26/2020   Procedure: VITRECTOMY 25 GAUGE WITH SCLERAL BUCKLE;  Surgeon: Bernarda Caffey, MD;  Location: [place];  Service: Ophthalmology;  Laterality: Left;   YAG LASER APPLICATION     FAMILY HISTORY Family History   Problem Relation Age of Onset   Hypertension Mother    SOCIAL HISTORY Social History   Tobacco Use   Smoking status: Every Day    Packs/day: 1.50    Years: 42.00    Pack years: 63.00    Types: Cigarettes   Smokeless tobacco: Current    Types: Snuff  Vaping Use   Vaping Use: Never used  Substance Use Topics   Alcohol use: Yes    Alcohol/week: 14.0 standard drinks    Types: 14 Standard drinks or equivalent per week   Drug use: Yes    Types: Marijuana    Comment: last use 08/15/20       OPHTHALMIC EXAM: Base Eye Exam     Visual Acuity (Snellen - Linear)       Right Left   Dist [place] 20/20 -1 20/80 -1   Dist ph [place]  20/40 -1         Tonometry (Tonopen, 8:12 AM)       Right Left   Pressure 18 13         Pupils       Dark Light Shape React APD   Right 3 2 Round Minimal None   Left 4 4 Round None None         Visual Fields (Counting fingers)       Left Right    Full Full         Extraocular Movement       Right Left    Full, Ortho Full, Ortho         Neuro/Psych     Oriented x3: Yes   Mood/Affect: Normal         Dilation     Both eyes: 1.0% Mydriacyl, 2.5% Phenylephrine @ 8:12 AM           Slit Lamp and Fundus Exam     External Exam       Right Left   External Normal Normal         Slit Lamp Exam       Right Left   Lids/Lashes Dermatochalasis - upper lid, Meibomian gland dysfunction, Telangiectasia Dermatochalasis - upper lid, Meibomian gland dysfunction, Telangiectasia   Conjunctiva/Sclera White and quiet White and quiet, sutures intact   Cornea arcus, trace PEE, well healed cataract wound arcus, 2-3+ Punctate epithelial erosions   Anterior Chamber Deep and quiet Deep and quiet, narrow angles, 0.5+ cell/pigment   Iris Round and dilated Round and mod dilated   Lens PC IOL in good position with open PC PC IOL in good position with open PC   Vitreous Vitreous syneresis, Posterior vitreous detachment, vitreous condensations  post vitrectomy, ~2% gas bubble         Fundus Exam       Right Left   Disc Pink and Sharp, +cupping, peripapillary nevus approx 0.7DD at 0600 disc mild Pallor, Sharp rim, +cupping   C/D Ratio 0.7 0.6   Macula Flat, Blunted foveal reflex, RPE mottling, No heme or edema Flat, Good foveal reflex,  No heme or edema   Vessels attenuated, Tortuous, mild Copper wiring attenuated, Tortuous   Periphery Attached    Retina attached over buckle, good buckle height, good laser over buckle and around breaks, focal pre-retinal heme at 0430 just posterior to buckle; PRE: OP: bullous total RD with +corrugations and 2 HST at 1000 and 1100            IMAGING AND PROCEDURES  Imaging and Procedures for 10/28/2020  OCT, Retina - OU - Both Eyes       Right Eye Quality was good. Central Foveal Thickness: 277. Progression has been stable. Findings include normal foveal contour, no IRF, no SRF.   Left Eye Quality was good. Central Foveal Thickness: 252. Progression has been stable. Findings include normal foveal contour, no IRF, no SRF, outer retinal atrophy (Retina reattached, patchy ellipsoid thinning - improving).   Notes *Images captured and stored on drive  Diagnosis / Impression:  OD: NFP, no IRF/SRF OS: retina reattached; patchy ellipsoid thinning -- improving  Clinical management:  See below  Abbreviations: NFP - Normal foveal profile. CME - cystoid macular edema. PED - pigment epithelial detachment. IRF - intraretinal fluid. SRF - subretinal fluid. EZ - ellipsoid zone. ERM - epiretinal membrane. ORA - outer retinal atrophy. ORT - outer retinal tubulation. SRHM - subretinal hyper-reflective material. IRHM - intraretinal hyper-reflective material             ASSESSMENT/PLAN:   ICD-10-CM   1. Left retinal detachment  H33.22     2. Retinal edema  H35.81 OCT, Retina - OU - Both Eyes    3. Essential hypertension  I10     4. Hypertensive retinopathy of both eyes  H35.033     5.  Pseudophakia, both eyes  Z96.1      1,2. Rhegmatogenous retinal detachment, OS - bullous total mac off detachment, pt reports decreased vision for 5-6 wks - two horseshoe tears at 1000 and 1100 - POM2 s/p SBP + PPV/PFC/EL/FAX/14% C3F8 OS, 08.25.2022             - doing well             - retina attached and in good position -- good buckle height and laser around breaks             - IOP good at 13  - epi defect stably closed but still with 2-3+ PEE  - gas bubble 2%             - start PF taper -- 3,2,1 drops daily, decrease weekly  - cont AT's QID OS and Refresh ointment qhs OS             - post op drop and positioning instructions reviewed  - medically cleared to return to work on 11.7.22  - f/u 3-4 wks for POV   3,4. Hypertensive retinopathy OU - discussed importance of tight BP control - monitor   5. Pseudophakia OU  - s/p CE/IOL OU (Dr. Manuella Ghazi)  - IOL in good position, doing well  - monitor  Ophthalmic Meds Ordered this visit:  No orders of the defined types were placed in this encounter.     Return for f/u 3-4 weeks, RD OS, DFE, OCT.  There are no Patient Instructions on file for this visit.   This document serves as a record of services personally performed by Gardiner Sleeper, MD, PhD. It was created on their behalf by San Jetty. Owens Shark, OA an ophthalmic  technician. The creation of this record is the provider's dictation and/or activities during the visit.    Electronically signed by: San Jetty. Owens Shark, [place] 10.24.2022 12:48 PM   Gardiner Sleeper, M.D., Ph.D. Diseases & Surgery of the Retina and Vitreous [facility]  I have reviewed the above documentation for accuracy and completeness, and I agree with the above. Gardiner Sleeper, M.D., Ph.D. 10/29/20 12:59 PM   Abbreviations: M myopia (nearsighted); A astigmatism; H hyperopia (farsighted); P presbyopia; Mrx spectacle prescription;  CTL contact lenses; OD right eye; OS left eye; OU both eyes  XT  exotropia; ET esotropia; PEK punctate epithelial keratitis; PEE punctate epithelial erosions; DES dry eye syndrome; MGD meibomian gland dysfunction; ATs artificial tears; PFAT's preservative free artificial tears; [place] nuclear sclerotic cataract; PSC posterior subcapsular cataract; ERM epi-retinal membrane; PVD posterior vitreous detachment; RD retinal detachment; DM diabetes mellitus; DR diabetic retinopathy; NPDR non-proliferative diabetic retinopathy; PDR proliferative diabetic retinopathy; CSME clinically significant macular edema; DME diabetic macular edema; dbh dot blot hemorrhages; CWS cotton wool spot; POAG primary open angle glaucoma; C/D cup-to-disc ratio; HVF humphrey visual field; GVF goldmann visual field; OCT optical coherence tomography; IOP intraocular pressure; BRVO Branch retinal vein occlusion; CRVO central retinal vein occlusion; CRAO central retinal artery occlusion; BRAO branch retinal artery occlusion; RT retinal tear; SB scleral buckle; PPV pars plana vitrectomy; VH Vitreous hemorrhage; PRP panretinal laser photocoagulation; IVK intravitreal kenalog; VMT vitreomacular traction; MH Macular hole;  NVD neovascularization of the disc; NVE neovascularization elsewhere; AREDS age related eye disease study; ARMD age related macular degeneration; POAG primary open angle glaucoma; EBMD epithelial/anterior basement membrane dystrophy; ACIOL anterior chamber intraocular lens; IOL intraocular lens; PCIOL posterior chamber intraocular lens; Phaco/IOL phacoemulsification with intraocular lens placement; [place] photorefractive keratectomy; LASIK laser assisted in situ keratomileusis; HTN hypertension; DM diabetes mellitus; COPD chronic obstructive pulmonary disease

## 2020-10-28 ENCOUNTER — Ambulatory Visit (INDEPENDENT_AMBULATORY_CARE_PROVIDER_SITE_OTHER): Payer: BC Managed Care – PPO | Admitting: Ophthalmology

## 2020-10-28 ENCOUNTER — Other Ambulatory Visit: Payer: Self-pay

## 2020-10-28 ENCOUNTER — Encounter (INDEPENDENT_AMBULATORY_CARE_PROVIDER_SITE_OTHER): Payer: Self-pay | Admitting: Ophthalmology

## 2020-10-28 ENCOUNTER — Other Ambulatory Visit (INDEPENDENT_AMBULATORY_CARE_PROVIDER_SITE_OTHER): Payer: Self-pay | Admitting: Ophthalmology

## 2020-10-28 DIAGNOSIS — H35033 Hypertensive retinopathy, bilateral: Secondary | ICD-10-CM

## 2020-10-28 DIAGNOSIS — I10 Essential (primary) hypertension: Secondary | ICD-10-CM

## 2020-10-28 DIAGNOSIS — H3581 Retinal edema: Secondary | ICD-10-CM

## 2020-10-28 DIAGNOSIS — H3322 Serous retinal detachment, left eye: Secondary | ICD-10-CM

## 2020-10-28 DIAGNOSIS — Z961 Presence of intraocular lens: Secondary | ICD-10-CM

## 2020-10-29 ENCOUNTER — Encounter (INDEPENDENT_AMBULATORY_CARE_PROVIDER_SITE_OTHER): Payer: Self-pay | Admitting: Ophthalmology

## 2020-11-01 ENCOUNTER — Other Ambulatory Visit (INDEPENDENT_AMBULATORY_CARE_PROVIDER_SITE_OTHER): Payer: Self-pay

## 2020-11-01 MED ORDER — PREDNISOLONE ACETATE 1 % OP SUSP: OPHTHALMIC | 0 refills | Status: AC

## 2020-11-05 ENCOUNTER — Telehealth (INDEPENDENT_AMBULATORY_CARE_PROVIDER_SITE_OTHER): Payer: Self-pay

## 2020-11-05 NOTE — Telephone Encounter (Signed)
Pt called to update [place] that his gas bubble was the size of a BB and only visible when he looks down. Pt is scheduled to return to work 11/7. It was confirmed with Leeann Must that she had spoken with patient's boss and sent a letter of release back to work contingent on the gas bubble being gone. Mr. Parker Hall was advised that if the gas bubble was still visible come Monday to call the office. MS

## 2020-11-11 NOTE — Progress Notes (Signed)
[facility] Note  11/18/2020     CHIEF COMPLAINT Patient presents for Post-op Follow-up   HISTORY OF PRESENT ILLNESS: Parker Hall is a 65 y.o. male who presents to the clinic today for:   HPI     Post-op Follow-up   In left eye.  Discomfort includes none.  I, the attending physician,  performed the HPI with the patient and updated documentation appropriately.        Comments   12 week post op OS-  Vision may be a little better than before. Using Prednisolone qd, Systane QID, and Systane Gel qhs OS.       Last edited by Bernarda Caffey, MD on 11/18/2020  9:05 AM.     Pt states vision is blurry, gas bubble is gone, he is still using PF QD for 3 more days, Systane QID and Systane gel    Referring physician: No referring provider defined for this encounter.  HISTORICAL INFORMATION:  Selected notes from the MEDICAL RECORD NUMBER Referred by Dr. Zigmund Daniel Ocular Hx-Total Retinal Detachment OS   CURRENT MEDICATIONS: Current Outpatient Medications (Ophthalmic Drugs)  Medication Sig   prednisoLONE acetate (PRED FORTE) 1 % ophthalmic suspension INSTILL 1 DROP INTO LEFT EYE 4 TIMES A DAY   prednisoLONE acetate (PRED FORTE) 1 % ophthalmic suspension 1 drop into left eye three times daily for a week, twice a day for a week, once a day for a week, then D/C   No current facility-administered medications for this visit. (Ophthalmic Drugs)   Current Outpatient Medications (Other)  Medication Sig   Cholecalciferol (VITAMIN D-3) 125 MCG (5000 UT) TABS Take 5,000 Units by mouth at bedtime.   doxazosin (CARDURA) 2 MG tablet Take 2 mg by mouth at bedtime.   ezetimibe (ZETIA) 10 MG tablet Take 10 mg by mouth at bedtime.   fluticasone (FLONASE) 50 MCG/ACT nasal spray Place 2 sprays into both nostrils daily as needed for allergies or rhinitis.   Multiple Vitamins-Minerals (MULTIVITAMIN WITH MINERALS) tablet Take 1 tablet by mouth at bedtime.    HYDROcodone-acetaminophen (NORCO/VICODIN) 5-325 MG tablet Take 1 tablet by mouth every 4 (four) hours as needed for moderate pain. (Patient not taking: Reported on 11/18/2020)   No current facility-administered medications for this visit. (Other)   REVIEW OF SYSTEMS: ROS   Positive for: Eyes Negative for: Constitutional, Gastrointestinal, Neurological, Skin, Genitourinary, Musculoskeletal, HENT, Endocrine, Cardiovascular, Respiratory, Psychiatric, Allergic/Imm, Heme/Lymph Last edited by Leonie Douglas, COA on 11/18/2020  8:09 AM.      ALLERGIES No Known Allergies  PAST MEDICAL HISTORY Past Medical History:  Diagnosis Date   HLD (hyperlipidemia)    Hypertension    Hypertensive retinopathy    Retinal detachment    Past Surgical History:  Procedure Laterality Date   CATARACT EXTRACTION     EYE SURGERY     GAS INSERTION Left 08/26/2020   Procedure: INSERTION OF GAS;  Surgeon: Bernarda Caffey, MD;  Location: [place];  Service: Ophthalmology;  Laterality: Left;   GAS/FLUID EXCHANGE Left 08/26/2020   Procedure: GAS/FLUID EXCHANGE;  Surgeon: Bernarda Caffey, MD;  Location: [place];  Service: Ophthalmology;  Laterality: Left;   HERNIA REPAIR     LASER PHOTO ABLATION Left 08/26/2020   Procedure: LASER PHOTO ABLATION;  Surgeon: Bernarda Caffey, MD;  Location: [place];  Service: Ophthalmology;  Laterality: Left;   PERFLUORONE INJECTION Left 08/26/2020   Procedure: PERFLUORONE INJECTION;  Surgeon: Bernarda Caffey, MD;  Location: [place];  Service: Ophthalmology;  Laterality: Left;   REPAIR OF COMPLEX TRACTION RETINAL DETACHMENT Left 08/26/2020   Procedure: REPAIR OF COMPLEX TRACTION RETINAL DETACHMENT;  Surgeon: Bernarda Caffey, MD;  Location: [place];  Service: Ophthalmology;  Laterality: Left;   RETINAL DETACHMENT SURGERY     skull/head surgery     VITRECTOMY 25 GAUGE WITH SCLERAL BUCKLE Left 08/26/2020   Procedure: VITRECTOMY 25 GAUGE WITH SCLERAL BUCKLE;  Surgeon: Bernarda Caffey, MD;  Location: [place];   Service: Ophthalmology;  Laterality: Left;   YAG LASER APPLICATION     FAMILY HISTORY Family History  Problem Relation Age of Onset   Hypertension Mother    SOCIAL HISTORY Social History   Tobacco Use   Smoking status: Every Day    Packs/day: 1.50    Years: 42.00    Pack years: 63.00    Types: Cigarettes   Smokeless tobacco: Current    Types: Snuff  Vaping Use   Vaping Use: Never used  Substance Use Topics   Alcohol use: Yes    Alcohol/week: 14.0 standard drinks    Types: 14 Standard drinks or equivalent per week   Drug use: Yes    Types: Marijuana    Comment: last use 08/15/20       OPHTHALMIC EXAM: Base Eye Exam     Visual Acuity (Snellen - Linear)       Right Left   Dist [place] 20/25 +2 20/80 +2   Dist ph   20/40 +2         Tonometry (Tonopen, 8:16 AM)       Right Left   Pressure 13 10         Pupils       Dark Light Shape React APD   Right 3 2 Round Minimal None   Left 4 4 Round NR          Visual Fields (Counting fingers)       Left Right    Full Full         Extraocular Movement       Right Left    Full, Ortho Full, Ortho         Neuro/Psych     Oriented x3: Yes   Mood/Affect: Normal         Dilation     Both eyes: 1.0% Mydriacyl, 2.5% Phenylephrine @ 8:16 AM           Slit Lamp and Fundus Exam     External Exam       Right Left   External Normal Normal         Slit Lamp Exam       Right Left   Lids/Lashes Dermatochalasis - upper lid, Meibomian gland dysfunction, Telangiectasia Dermatochalasis - upper lid, Meibomian gland dysfunction, Telangiectasia   Conjunctiva/Sclera White and quiet White and quiet   Cornea arcus, trace PEE, well healed cataract wound arcus, trace Punctate epithelial erosions, well healed cataract wound   Anterior Chamber Deep and quiet Deep and quiet, narrow angles   Iris Round and dilated Round and mod dilated   Lens PC IOL in good position with open PC PC IOL in good position with  open PC   Anterior Vitreous Vitreous syneresis, Posterior vitreous detachment, vitreous condensations post vitrectomy, gas bubble gone         Fundus Exam       Right Left   Disc Pink and Sharp, +cupping, peripapillary nevus approx 0.7DD at 0600 disc mild Pallor, Sharp rim, +cupping  C/D Ratio 0.7 0.6   Macula Flat, Blunted foveal reflex, RPE mottling, No heme or edema Flat, Good foveal reflex, No heme or edema   Vessels attenuated, Tortuous, mild Copper wiring attenuated, Tortuous   Periphery Attached Retina attached over buckle, good buckle height, good laser over buckle and around breaks, focal pre-retinal heme at 0430 just posterior to buckle - improving; PRE: OP: bullous total RD with +corrugations and 2 HST at 1000 and 1100            IMAGING AND PROCEDURES  Imaging and Procedures for 11/18/2020  OCT, Retina - OU - Both Eyes       Right Eye Quality was good. Central Foveal Thickness: 272. Progression has been stable. Findings include normal foveal contour, no IRF, no SRF.   Left Eye Quality was good. Central Foveal Thickness: 256. Progression has been stable. Findings include normal foveal contour, no IRF, no SRF, outer retinal atrophy (Retina reattached, patchy ellipsoid thinning - improving, trace ERM).   Notes *Images captured and stored on drive  Diagnosis / Impression:  OD: NFP, no IRF/SRF OS: retina reattached; patchy ellipsoid thinning -- improving  Clinical management:  See below  Abbreviations: NFP - Normal foveal profile. CME - cystoid macular edema. PED - pigment epithelial detachment. IRF - intraretinal fluid. SRF - subretinal fluid. EZ - ellipsoid zone. ERM - epiretinal membrane. ORA - outer retinal atrophy. ORT - outer retinal tubulation. SRHM - subretinal hyper-reflective material. IRHM - intraretinal hyper-reflective material            ASSESSMENT/PLAN:   ICD-10-CM   1. Left retinal detachment  H33.22     2. Retinal edema  H35.81 OCT,  Retina - OU - Both Eyes    3. Essential hypertension  I10     4. Hypertensive retinopathy of both eyes  H35.033     5. Pseudophakia, both eyes  Z96.1      1,2. Rhegmatogenous retinal detachment, OS - bullous total mac off detachment, pt reports decreased vision for 5-6 wks - two horseshoe tears at 1000 and 1100 - s/p SBP + PPV/PFC/EL/FAX/14% C3F8 OS, 08.25.2022             - doing well             - retina attached and in good position -- good buckle height and laser around breaks             - IOP good at 10  - epi defect stably closed but still with mild PEE  - gas bubble gone             - completing PF taper -- using qd for 3 more days  - cont AT's QID OS and Refresh ointment qhs OS  - medically cleared to return to work on 11.7.22  - f/u 3 months for DFE/OCT   3,4. Hypertensive retinopathy OU - discussed importance of tight BP control - monitor   5. Pseudophakia OU  - s/p CE/IOL OU (Dr. Manuella Ghazi)  - IOL in good position, doing well  - monitor  Ophthalmic Meds Ordered this visit:  No orders of the defined types were placed in this encounter.     Return in about 3 months (around 02/18/2021) for f/u RD OS.  There are no Patient Instructions on file for this visit.   This document serves as a record of services personally performed by Gardiner Sleeper, MD, PhD. It was created on their behalf by San Jetty. Owens Shark,  OA an ophthalmic technician. The creation of this record is the provider's dictation and/or activities during the visit.    Electronically signed by: San Jetty. [place], [place] 11.10.2022 9:08 AM  Gardiner Sleeper, M.D., Ph.D. Diseases & Surgery of the Retina and Vitreous [facility]  I have reviewed the above documentation for accuracy and completeness, and I agree with the above. Gardiner Sleeper, M.D., Ph.D. 11/18/20 9:08 AM   Abbreviations: M myopia (nearsighted); A astigmatism; H hyperopia (farsighted); P presbyopia; Mrx spectacle  prescription;  CTL contact lenses; OD right eye; OS left eye; OU both eyes  XT exotropia; ET esotropia; PEK punctate epithelial keratitis; PEE punctate epithelial erosions; DES dry eye syndrome; MGD meibomian gland dysfunction; ATs artificial tears; PFAT's preservative free artificial tears; [place] nuclear sclerotic cataract; PSC posterior subcapsular cataract; ERM epi-retinal membrane; PVD posterior vitreous detachment; RD retinal detachment; DM diabetes mellitus; DR diabetic retinopathy; NPDR non-proliferative diabetic retinopathy; PDR proliferative diabetic retinopathy; CSME clinically significant macular edema; DME diabetic macular edema; dbh dot blot hemorrhages; CWS cotton wool spot; POAG primary open angle glaucoma; C/D cup-to-disc ratio; HVF humphrey visual field; GVF goldmann visual field; OCT optical coherence tomography; IOP intraocular pressure; BRVO Branch retinal vein occlusion; CRVO central retinal vein occlusion; CRAO central retinal artery occlusion; BRAO branch retinal artery occlusion; RT retinal tear; SB scleral buckle; PPV pars plana vitrectomy; VH Vitreous hemorrhage; PRP panretinal laser photocoagulation; IVK intravitreal kenalog; VMT vitreomacular traction; MH Macular hole;  NVD neovascularization of the disc; NVE neovascularization elsewhere; AREDS age related eye disease study; ARMD age related macular degeneration; POAG primary open angle glaucoma; EBMD epithelial/anterior basement membrane dystrophy; ACIOL anterior chamber intraocular lens; IOL intraocular lens; PCIOL posterior chamber intraocular lens; Phaco/IOL phacoemulsification with intraocular lens placement; [place] photorefractive keratectomy; LASIK laser assisted in situ keratomileusis; HTN hypertension; DM diabetes mellitus; COPD chronic obstructive pulmonary disease

## 2020-11-18 ENCOUNTER — Ambulatory Visit (INDEPENDENT_AMBULATORY_CARE_PROVIDER_SITE_OTHER): Payer: BC Managed Care – PPO | Admitting: Ophthalmology

## 2020-11-18 ENCOUNTER — Encounter (INDEPENDENT_AMBULATORY_CARE_PROVIDER_SITE_OTHER): Payer: Self-pay | Admitting: Ophthalmology

## 2020-11-18 ENCOUNTER — Other Ambulatory Visit: Payer: Self-pay

## 2020-11-18 DIAGNOSIS — H3322 Serous retinal detachment, left eye: Secondary | ICD-10-CM

## 2020-11-18 DIAGNOSIS — H35033 Hypertensive retinopathy, bilateral: Secondary | ICD-10-CM

## 2020-11-18 DIAGNOSIS — H3581 Retinal edema: Secondary | ICD-10-CM

## 2020-11-18 DIAGNOSIS — I10 Essential (primary) hypertension: Secondary | ICD-10-CM | POA: Diagnosis not present

## 2020-11-18 DIAGNOSIS — Z961 Presence of intraocular lens: Secondary | ICD-10-CM

## 2021-02-10 NOTE — Progress Notes (Signed)
[facility] Note  02/18/2021     CHIEF COMPLAINT Patient presents for Retina Follow Up    HISTORY OF PRESENT ILLNESS: Parker Hall is a 66 y.o. male who presents to the clinic today for:   HPI     Retina Follow Up   Patient presents with  Retinal Break/Detachment.  In left eye.  Severity is moderate.  Duration of 3 months.  Since onset it is stable.  I, the attending physician,  performed the HPI with the patient and updated documentation appropriately.        Comments   Patient states vision still good OS. No new floaters or flashes.       Last edited by Bernarda Caffey, MD on 02/18/2021 12:53 PM.    Pt states he has a "wave" in his left eye that he did not notice until he got new glasses, he is only using AT's 1-2x/day   Referring physician: No referring provider defined for this encounter.  HISTORICAL INFORMATION:  Selected notes from the MEDICAL RECORD NUMBER Referred by Dr. Zigmund Daniel Ocular Hx-Total Retinal Detachment OS   CURRENT MEDICATIONS: Current Outpatient Medications (Ophthalmic Drugs)  Medication Sig   prednisoLONE acetate (PRED FORTE) 1 % ophthalmic suspension INSTILL 1 DROP INTO LEFT EYE 4 TIMES A DAY (Patient not taking: Reported on 02/18/2021)   prednisoLONE acetate (PRED FORTE) 1 % ophthalmic suspension 1 drop into left eye three times daily for a week, twice a day for a week, once a day for a week, then D/C (Patient not taking: Reported on 02/18/2021)   No current facility-administered medications for this visit. (Ophthalmic Drugs)   Current Outpatient Medications (Other)  Medication Sig   Cholecalciferol (VITAMIN D-3) 125 MCG (5000 UT) TABS Take 5,000 Units by mouth at bedtime.   doxazosin (CARDURA) 2 MG tablet Take 2 mg by mouth at bedtime.   ezetimibe (ZETIA) 10 MG tablet Take 10 mg by mouth at bedtime.   fluticasone (FLONASE) 50 MCG/ACT nasal spray Place 2 sprays into both nostrils daily as needed for allergies or  rhinitis.   Multiple Vitamins-Minerals (MULTIVITAMIN WITH MINERALS) tablet Take 1 tablet by mouth at bedtime.   HYDROcodone-acetaminophen (NORCO/VICODIN) 5-325 MG tablet Take 1 tablet by mouth every 4 (four) hours as needed for moderate pain. (Patient not taking: Reported on 11/18/2020)   No current facility-administered medications for this visit. (Other)   REVIEW OF SYSTEMS: ROS   Positive for: Eyes Negative for: Constitutional, Gastrointestinal, Neurological, Skin, Genitourinary, Musculoskeletal, HENT, Endocrine, Cardiovascular, Respiratory, Psychiatric, Allergic/Imm, Heme/Lymph Last edited by Roselee Nova D, COT on 02/18/2021  7:56 AM.     ALLERGIES No Known Allergies  PAST MEDICAL HISTORY Past Medical History:  Diagnosis Date   HLD (hyperlipidemia)    Hypertension    Hypertensive retinopathy    Retinal detachment    Past Surgical History:  Procedure Laterality Date   CATARACT EXTRACTION     EYE SURGERY     GAS INSERTION Left 08/26/2020   Procedure: INSERTION OF GAS;  Surgeon: Bernarda Caffey, MD;  Location: [facility];  Service: Ophthalmology;  Laterality: Left;   GAS/FLUID EXCHANGE Left 08/26/2020   Procedure: GAS/FLUID EXCHANGE;  Surgeon: Bernarda Caffey, MD;  Location: [facility];  Service: Ophthalmology;  Laterality: Left;   HERNIA REPAIR     LASER PHOTO ABLATION Left 08/26/2020   Procedure: LASER PHOTO ABLATION;  Surgeon: Bernarda Caffey, MD;  Location: [facility];  Service: Ophthalmology;  Laterality: Left;   PERFLUORONE INJECTION Left  08/26/2020   Procedure: PERFLUORONE INJECTION;  Surgeon: Bernarda Caffey, MD;  Location: [facility];  Service: Ophthalmology;  Laterality: Left;   REPAIR OF COMPLEX TRACTION RETINAL DETACHMENT Left 08/26/2020   Procedure: REPAIR OF COMPLEX TRACTION RETINAL DETACHMENT;  Surgeon: Bernarda Caffey, MD;  Location: [facility];  Service: Ophthalmology;  Laterality: Left;   RETINAL DETACHMENT SURGERY     skull/head surgery     VITRECTOMY 25 GAUGE WITH SCLERAL BUCKLE Left  08/26/2020   Procedure: VITRECTOMY 25 GAUGE WITH SCLERAL BUCKLE;  Surgeon: Bernarda Caffey, MD;  Location: [facility];  Service: Ophthalmology;  Laterality: Left;   YAG LASER APPLICATION     FAMILY HISTORY Family History  Problem Relation Age of Onset   Hypertension Mother    SOCIAL HISTORY Social History   Tobacco Use   Smoking status: Every Day    Packs/day: 1.50    Years: 42.00    Pack years: 63.00    Types: Cigarettes   Smokeless tobacco: Current    Types: Snuff  Vaping Use   Vaping Use: Never used  Substance Use Topics   Alcohol use: Yes    Alcohol/week: 14.0 standard drinks    Types: 14 Standard drinks or equivalent per week   Drug use: Yes    Types: Marijuana    Comment: last use 08/15/20       OPHTHALMIC EXAM: Base Eye Exam     Visual Acuity (Snellen - Linear)       Right Left   Dist cc 20/25 20/25 -2   Dist ph cc NI NI         Tonometry (Tonopen, 8:06 AM)       Right Left   Pressure 14 10         Pupils       Dark Light Shape React APD   Right 2 1 Round Slow None   Left 3 2 Round Brisk None         Visual Fields (Counting fingers)       Left Right    Full Full         Extraocular Movement       Right Left    Full, Ortho Full, Ortho         Neuro/Psych     Oriented x3: Yes   Mood/Affect: Normal         Dilation     Both eyes: 1.0% Mydriacyl, 2.5% Phenylephrine @ 8:06 AM           Slit Lamp and Fundus Exam     External Exam       Right Left   External Normal Normal         Slit Lamp Exam       Right Left   Lids/Lashes Dermatochalasis - upper lid, Meibomian gland dysfunction, Telangiectasia Dermatochalasis - upper lid, Meibomian gland dysfunction, Telangiectasia   Conjunctiva/Sclera White and quiet White and quiet   Cornea arcus, trace PEE, well healed cataract wound arcus, 2+Punctate epithelial erosions, well healed cataract wound   Anterior Chamber Deep and quiet Deep and quiet, narrow angles   Iris Round  and dilated Round and mod dilated   Lens PC IOL in good position with open PC PC IOL in good position with open PC   Anterior Vitreous Vitreous syneresis, Posterior vitreous detachment, vitreous condensations post vitrectomy, gas bubble gone         Fundus Exam       Right Left   Disc  Pink and Sharp, +cupping, peripapillary nevus approx 0.7DD at 0600 disc mild Pallor, Sharp rim, +cupping   C/D Ratio 0.7 0.6   Macula Flat, Blunted foveal reflex, RPE mottling, trace ERM inferiorly, No heme or edema Flat, Good foveal reflex, ERM nasal and superior macula, No heme or edema   Vessels attenuated, Tortuous, mild Copper wiring attenuated, Tortuous, Copper wiring   Periphery Attached Retina attached over buckle, good buckle height, good laser over buckle and around breaks, PRE: OP: bullous total RD with +corrugations and 2 HST at 1000 and 1100           Refraction     Wearing Rx       Sphere Cylinder Axis Add   Right [place] Sphere  +2.75   Left -2.75 +1.00 020 +2.75            IMAGING AND PROCEDURES  Imaging and Procedures for 02/18/2021  OCT, Retina - OU - Both Eyes       Right Eye Quality was good. Central Foveal Thickness: 277. Progression has been stable. Findings include normal foveal contour, no IRF, no SRF.   Left Eye Quality was good. Central Foveal Thickness: 289. Progression has worsened. Findings include normal foveal contour, no IRF, no SRF, outer retinal atrophy, epiretinal membrane (Retina reattached, patchy ellipsoid thinning - improving, interval development of ERM and pucker superior and nasal macula).   Notes *Images captured and stored on drive  Diagnosis / Impression:  OD: NFP, no IRF/SRF OS: Retina reattached, patchy ellipsoid thinning - improving, interval development of ERM and pucker superior and nasal macula  Clinical management:  See below  Abbreviations: NFP - Normal foveal profile. CME - cystoid macular edema. PED - pigment epithelial  detachment. IRF - intraretinal fluid. SRF - subretinal fluid. EZ - ellipsoid zone. ERM - epiretinal membrane. ORA - outer retinal atrophy. ORT - outer retinal tubulation. SRHM - subretinal hyper-reflective material. IRHM - intraretinal hyper-reflective material            ASSESSMENT/PLAN:   ICD-10-CM   1. Left retinal detachment  H33.22 OCT, Retina - OU - Both Eyes    2. Essential hypertension  I10     3. Hypertensive retinopathy of both eyes  H35.033 OCT, Retina - OU - Both Eyes    4. Epiretinal membrane (ERM) of left eye  H35.372 OCT, Retina - OU - Both Eyes    5. Pseudophakia, both eyes  Z96.1      1. Rhegmatogenous retinal detachment, OS - bullous total mac off detachment, pt reports decreased vision for 5-6 wks - two horseshoe tears at 1000 and 1100 - s/p SBP + PPV/PFC/EL/FAX/14% C3F8 OS, 08.25.2022             - doing well             - retina attached and in good position -- good buckle height and laser around breaks             - IOP good at 10  - cont AT's QID OS and Refresh ointment qhs OS  - f/u 4 months for DFE/OCT   2,3. Hypertensive retinopathy OU - discussed importance of tight BP control - monitor    4. Epiretinal membrane, left eye  - The natural history, anatomy, potential for loss of vision, and treatment options including vitrectomy techniques and the complications of endophthalmitis, retinal detachment, vitreous hemorrhage, cataract progression and permanent vision loss discussed with the patient. - mild ERM -- progressed from prior -  BCVA 20/25 - +metamorphopsia - no indication for surgery at this time - monitor for now - f/u 4 mos -- DFE/OCT  5. Pseudophakia OU  - s/p CE/IOL OU (Dr. Manuella Ghazi)  - IOL in good position, doing well  - monitor   Ophthalmic Meds Ordered this visit:  No orders of the defined types were placed in this encounter.    Return in about 4 months (around 06/18/2021) for f/u ERM OS, DFE, OCT.  There are no Patient Instructions  on file for this visit.   This document serves as a record of services personally performed by Gardiner Sleeper, MD, PhD. It was created on their behalf by Leonie Douglas, an ophthalmic technician. The creation of this record is the provider's dictation and/or activities during the visit.    Electronically signed by: Leonie Douglas COA, 02/18/21  12:54 PM  This document serves as a record of services personally performed by Gardiner Sleeper, MD, PhD. It was created on their behalf by San Jetty. Owens Shark, OA an ophthalmic technician. The creation of this record is the provider's dictation and/or activities during the visit.    Electronically signed by: San Jetty. Owens Shark, [place] 02.17.2023 12:54 PM  Gardiner Sleeper, M.D., Ph.D. Diseases & Surgery of the Retina and Vitreous [facility]  I have reviewed the above documentation for accuracy and completeness, and I agree with the above. Gardiner Sleeper, M.D., Ph.D. 02/18/21 12:55 PM   Abbreviations: M myopia (nearsighted); A astigmatism; H hyperopia (farsighted); P presbyopia; Mrx spectacle prescription;  CTL contact lenses; OD right eye; OS left eye; OU both eyes  XT exotropia; ET esotropia; PEK punctate epithelial keratitis; PEE punctate epithelial erosions; DES dry eye syndrome; MGD meibomian gland dysfunction; ATs artificial tears; PFAT's preservative free artificial tears; [place] nuclear sclerotic cataract; PSC posterior subcapsular cataract; ERM epi-retinal membrane; PVD posterior vitreous detachment; RD retinal detachment; DM diabetes mellitus; DR diabetic retinopathy; NPDR non-proliferative diabetic retinopathy; PDR proliferative diabetic retinopathy; CSME clinically significant macular edema; DME diabetic macular edema; dbh dot blot hemorrhages; CWS cotton wool spot; POAG primary open angle glaucoma; C/D cup-to-disc ratio; HVF humphrey visual field; GVF goldmann visual field; OCT optical coherence tomography; IOP intraocular pressure; BRVO  Branch retinal vein occlusion; CRVO central retinal vein occlusion; CRAO central retinal artery occlusion; BRAO branch retinal artery occlusion; RT retinal tear; SB scleral buckle; PPV pars plana vitrectomy; VH Vitreous hemorrhage; PRP panretinal laser photocoagulation; IVK intravitreal kenalog; VMT vitreomacular traction; MH Macular hole;  NVD neovascularization of the disc; NVE neovascularization elsewhere; AREDS age related eye disease study; ARMD age related macular degeneration; POAG primary open angle glaucoma; EBMD epithelial/anterior basement membrane dystrophy; ACIOL anterior chamber intraocular lens; IOL intraocular lens; PCIOL posterior chamber intraocular lens; Phaco/IOL phacoemulsification with intraocular lens placement; [place] photorefractive keratectomy; LASIK laser assisted in situ keratomileusis; HTN hypertension; DM diabetes mellitus; COPD chronic obstructive pulmonary disease

## 2021-02-18 ENCOUNTER — Ambulatory Visit (INDEPENDENT_AMBULATORY_CARE_PROVIDER_SITE_OTHER): Payer: BC Managed Care – PPO | Admitting: Ophthalmology

## 2021-02-18 ENCOUNTER — Encounter (INDEPENDENT_AMBULATORY_CARE_PROVIDER_SITE_OTHER): Payer: Self-pay | Admitting: Ophthalmology

## 2021-02-18 ENCOUNTER — Other Ambulatory Visit: Payer: Self-pay

## 2021-02-18 DIAGNOSIS — H35372 Puckering of macula, left eye: Secondary | ICD-10-CM

## 2021-02-18 DIAGNOSIS — Z961 Presence of intraocular lens: Secondary | ICD-10-CM

## 2021-02-18 DIAGNOSIS — H3322 Serous retinal detachment, left eye: Secondary | ICD-10-CM

## 2021-02-18 DIAGNOSIS — I10 Essential (primary) hypertension: Secondary | ICD-10-CM | POA: Diagnosis not present

## 2021-02-18 DIAGNOSIS — H35033 Hypertensive retinopathy, bilateral: Secondary | ICD-10-CM

## 2021-04-13 DIAGNOSIS — E782 Mixed hyperlipidemia: Secondary | ICD-10-CM | POA: Diagnosis not present

## 2021-04-13 DIAGNOSIS — R748 Abnormal levels of other serum enzymes: Secondary | ICD-10-CM | POA: Diagnosis not present

## 2021-04-13 DIAGNOSIS — E790 Hyperuricemia without signs of inflammatory arthritis and tophaceous disease: Secondary | ICD-10-CM | POA: Diagnosis not present

## 2021-04-13 DIAGNOSIS — I10 Essential (primary) hypertension: Secondary | ICD-10-CM | POA: Diagnosis not present

## 2021-04-13 DIAGNOSIS — R7303 Prediabetes: Secondary | ICD-10-CM | POA: Diagnosis not present

## 2021-06-20 DIAGNOSIS — I10 Essential (primary) hypertension: Secondary | ICD-10-CM | POA: Diagnosis not present

## 2021-06-20 DIAGNOSIS — K122 Cellulitis and abscess of mouth: Secondary | ICD-10-CM | POA: Diagnosis not present

## 2021-06-24 ENCOUNTER — Encounter (INDEPENDENT_AMBULATORY_CARE_PROVIDER_SITE_OTHER): Payer: BC Managed Care – PPO | Admitting: Ophthalmology

## 2021-06-24 DIAGNOSIS — H35372 Puckering of macula, left eye: Secondary | ICD-10-CM

## 2021-06-24 DIAGNOSIS — H35033 Hypertensive retinopathy, bilateral: Secondary | ICD-10-CM

## 2021-06-24 DIAGNOSIS — H3322 Serous retinal detachment, left eye: Secondary | ICD-10-CM

## 2021-06-24 DIAGNOSIS — Z961 Presence of intraocular lens: Secondary | ICD-10-CM

## 2021-06-24 DIAGNOSIS — I10 Essential (primary) hypertension: Secondary | ICD-10-CM

## 2021-07-06 NOTE — Progress Notes (Addendum)
[facility] Note  07/08/2021     CHIEF COMPLAINT Patient presents for Retina Follow Up    HISTORY OF PRESENT ILLNESS: Parker Hall is a 66 y.o. male who presents to the clinic today for:   HPI     Retina Follow Up   Patient presents with  Other.  In left eye.  This started 5 months ago.  I, the attending physician,  performed the HPI with the patient and updated documentation appropriately.        Comments   Patient here for 5 months retina follow up for ERM OS. Hx RD OS. Patient states vision doing pretty good.  Sometimes OS hurts some like it is sore.       Last edited by Bernarda Caffey, MD on 07/08/2021  8:54 AM.    Pt states vision is stable, he states his eye hurts occasionally, he uses Refresh which seems to help   Referring physician: No referring provider defined for this encounter.  HISTORICAL INFORMATION:  Selected notes from the MEDICAL RECORD NUMBER Referred by Dr. Zigmund Daniel Ocular Hx-Total Retinal Detachment OS   CURRENT MEDICATIONS: Current Outpatient Medications (Ophthalmic Drugs)  Medication Sig   prednisoLONE acetate (PRED FORTE) 1 % ophthalmic suspension INSTILL 1 DROP INTO LEFT EYE 4 TIMES A DAY (Patient not taking: Reported on 02/18/2021)   prednisoLONE acetate (PRED FORTE) 1 % ophthalmic suspension 1 drop into left eye three times daily for a week, twice a day for a week, once a day for a week, then D/C (Patient not taking: Reported on 02/18/2021)   No current facility-administered medications for this visit. (Ophthalmic Drugs)   Current Outpatient Medications (Other)  Medication Sig   Cholecalciferol (VITAMIN D-3) 125 MCG (5000 UT) TABS Take 5,000 Units by mouth at bedtime.   doxazosin (CARDURA) 2 MG tablet Take 2 mg by mouth at bedtime.   ezetimibe (ZETIA) 10 MG tablet Take 10 mg by mouth at bedtime.   fluticasone (FLONASE) 50 MCG/ACT nasal spray Place 2 sprays into both nostrils daily as needed for allergies or  rhinitis.   Multiple Vitamins-Minerals (MULTIVITAMIN WITH MINERALS) tablet Take 1 tablet by mouth at bedtime.   HYDROcodone-acetaminophen (NORCO/VICODIN) 5-325 MG tablet Take 1 tablet by mouth every 4 (four) hours as needed for moderate pain. (Patient not taking: Reported on 11/18/2020)   No current facility-administered medications for this visit. (Other)   REVIEW OF SYSTEMS: ROS   Positive for: Eyes Negative for: Constitutional, Gastrointestinal, Neurological, Skin, Genitourinary, Musculoskeletal, HENT, Endocrine, Cardiovascular, Respiratory, Psychiatric, Allergic/Imm, Heme/Lymph Last edited by Theodore Demark, COA on 07/08/2021  7:56 AM.     ALLERGIES No Known Allergies  PAST MEDICAL HISTORY Past Medical History:  Diagnosis Date   HLD (hyperlipidemia)    Hypertension    Hypertensive retinopathy    Retinal detachment    Past Surgical History:  Procedure Laterality Date   CATARACT EXTRACTION     EYE SURGERY     GAS INSERTION Left 08/26/2020   Procedure: INSERTION OF GAS;  Surgeon: Bernarda Caffey, MD;  Location: [place];  Service: Ophthalmology;  Laterality: Left;   GAS/FLUID EXCHANGE Left 08/26/2020   Procedure: GAS/FLUID EXCHANGE;  Surgeon: Bernarda Caffey, MD;  Location: [place];  Service: Ophthalmology;  Laterality: Left;   HERNIA REPAIR     LASER PHOTO ABLATION Left 08/26/2020   Procedure: LASER PHOTO ABLATION;  Surgeon: Bernarda Caffey, MD;  Location: [place];  Service: Ophthalmology;  Laterality: Left;   PERFLUORONE INJECTION  Left 08/26/2020   Procedure: PERFLUORONE INJECTION;  Surgeon: Bernarda Caffey, MD;  Location: [place];  Service: Ophthalmology;  Laterality: Left;   REPAIR OF COMPLEX TRACTION RETINAL DETACHMENT Left 08/26/2020   Procedure: REPAIR OF COMPLEX TRACTION RETINAL DETACHMENT;  Surgeon: Bernarda Caffey, MD;  Location: [place];  Service: Ophthalmology;  Laterality: Left;   RETINAL DETACHMENT SURGERY     skull/head surgery     VITRECTOMY 25 GAUGE WITH SCLERAL BUCKLE Left  08/26/2020   Procedure: VITRECTOMY 25 GAUGE WITH SCLERAL BUCKLE;  Surgeon: Bernarda Caffey, MD;  Location: [place];  Service: Ophthalmology;  Laterality: Left;   YAG LASER APPLICATION     FAMILY HISTORY Family History  Problem Relation Age of Onset   Hypertension Mother    SOCIAL HISTORY Social History   Tobacco Use   Smoking status: Every Day    Packs/day: 1.50    Years: 42.00    Total pack years: 63.00    Types: Cigarettes   Smokeless tobacco: Current    Types: Snuff  Vaping Use   Vaping Use: Never used  Substance Use Topics   Alcohol use: Yes    Alcohol/week: 14.0 standard drinks of alcohol    Types: 14 Standard drinks or equivalent per week   Drug use: Yes    Types: Marijuana    Comment: last use 08/15/20       OPHTHALMIC EXAM: Base Eye Exam     Visual Acuity (Snellen - Linear)       Right Left   Dist cc 20/40 20/25 -2   Dist ph cc 20/25 -1 NI    Correction: Glasses         Tonometry (Tonopen, 7:53 AM)       Right Left   Pressure 13 10         Pupils       Dark Light Shape React APD   Right 2 1 Round Slow None   Left 3 2 Round Brisk None         Visual Fields (Counting fingers)       Left Right    Full Full         Extraocular Movement       Right Left    Full, Ortho Full, Ortho         Neuro/Psych     Oriented x3: Yes   Mood/Affect: Normal         Dilation     Both eyes: 1.0% Mydriacyl, 2.5% Phenylephrine @ 7:53 AM           Slit Lamp and Fundus Exam     External Exam       Right Left   External Normal Normal         Slit Lamp Exam       Right Left   Lids/Lashes Dermatochalasis - upper lid, mild Telangiectasia Dermatochalasis - upper lid, Telangiectasia   Conjunctiva/Sclera White and quiet White and quiet   Cornea arcus, trace PEE, well healed cataract wound arcus, 3+Punctate epithelial erosions, well healed cataract wound, irregular epi inferiorly, dry tear film   Anterior Chamber Deep and quiet Deep and  quiet, narrow angles   Iris Round and dilated Round and mod dilated   Lens PC IOL in good position with open PC PC IOL in good position with open PC   Anterior Vitreous Vitreous syneresis, Posterior vitreous detachment, vitreous condensations post vitrectomy, gas bubble gone         Fundus Exam  Right Left   Disc Mild pallor, Sharp rim, +cupping, peripapillary nevus approx 0.7DD at 0600 disc mild Pallor, Sharp rim, +cupping   C/D Ratio 0.7 0.6   Macula Flat, good foveal reflex, RPE mottling, mild ERM with early striae greatest SN mac, No heme or edema Flat, Good foveal reflex, ERM with striae greatest SN mac, No heme or edema   Vessels attenuated, Tortuous, mild Copper wiring attenuated, Tortuous, Copper wiring   Periphery Attached Retina attached over buckle, good buckle height, good laser over buckle and around breaks, PRE: OP: bullous total RD with +corrugations and 2 HST at 1000 and 1100           Refraction     Wearing Rx       Sphere Cylinder Axis Add   Right [place] Sphere  +2.75   Left -2.75 +1.00 020 +2.75           IMAGING AND PROCEDURES  Imaging and Procedures for 07/08/2021  OCT, Retina - OU - Both Eyes       Right Eye Quality was good. Central Foveal Thickness: 281. Progression has worsened. Findings include normal foveal contour, no IRF, no SRF, epiretinal membrane (Mild ERM with interval development of early pucker).   Left Eye Quality was good. Central Foveal Thickness: 318. Progression has been stable. Findings include normal foveal contour, no IRF, no SRF, epiretinal membrane, macular pucker, outer retinal atrophy (Retina reattached, patchy ellipsoid thinning - improving, stable ERM and early pucker superior and nasal macula).   Notes *Images captured and stored on drive  Diagnosis / Impression:  OD: Mild ERM with interval development of early pucker OS: Retina reattached, patchy ellipsoid thinning - improving, stable ERM and early pucker  superior and nasal macula  Clinical management:  See below  Abbreviations: NFP - Normal foveal profile. CME - cystoid macular edema. PED - pigment epithelial detachment. IRF - intraretinal fluid. SRF - subretinal fluid. EZ - ellipsoid zone. ERM - epiretinal membrane. ORA - outer retinal atrophy. ORT - outer retinal tubulation. SRHM - subretinal hyper-reflective material. IRHM - intraretinal hyper-reflective material            ASSESSMENT/PLAN:   ICD-10-CM   1. Left retinal detachment  H33.22     2. Essential hypertension  I10     3. Hypertensive retinopathy of both eyes  H35.033 OCT, Retina - OU - Both Eyes    4. Epiretinal membrane (ERM) of both eyes  H35.373 OCT, Retina - OU - Both Eyes    5. Pseudophakia, both eyes  Z96.1     6. Dry eyes  H04.123      1. Rhegmatogenous retinal detachment, OS - bullous total mac off detachment, pt reports decreased vision for 5-6 wks - two horseshoe tears at 1000 and 1100 - s/p SBP + PPV/PFC/EL/FAX/14% C3F8 OS, 08.25.2022             - doing well             - retina attached and in good position -- good buckle height and laser around breaks              - IOP good at 10  - cont AT's QID OS and Refresh ointment qhs OS for dry eye  - f/u 6 months for DFE/OCT   2,3. Hypertensive retinopathy OU - discussed importance of tight BP control - monitor    4. Epiretinal membrane OU   - OD: interval development of mild ERM w/ early  pucker - OS: mild ERM w/ early pucker -- stable - BCVA 20/25 OU - +metamorphopsia - no indication for surgery at this time - monitor for now - f/u 4 mos -- DFE/OCT  5. Pseudophakia OU  - s/p CE/IOL OU (Dr. Manuella Ghazi)  - IOL in good position, doing well  - monitor   6. Dry eyes OU (OS > OD) - recommend artificial tears and lubricating ointment as needed   Ophthalmic Meds Ordered this visit:  No orders of the defined types were placed in this encounter.    Return in about 6 months (around 01/08/2022) for f/u  ERM OS, DFE, OCT.  There are no Patient Instructions on file for this visit.   This document serves as a record of services personally performed by Gardiner Sleeper, MD, PhD. It was created on their behalf by Leonie Douglas, an ophthalmic technician. The creation of this record is the provider's dictation and/or activities during the visit.    Electronically signed by: Leonie Douglas COA, 07/08/21  9:00 AM  This document serves as a record of services personally performed by Gardiner Sleeper, MD, PhD. It was created on their behalf by San Jetty. Owens Shark, OA an ophthalmic technician. The creation of this record is the provider's dictation and/or activities during the visit.    Electronically signed by: San Jetty. Owens Shark, [place] 07.07.2023 9:00 AM  Gardiner Sleeper, M.D., Ph.D. Diseases & Surgery of the Retina and Vitreous [facility]  I have reviewed the above documentation for accuracy and completeness, and I agree with the above. Gardiner Sleeper, M.D., Ph.D. 07/08/21 9:00 AM   Abbreviations: M myopia (nearsighted); A astigmatism; H hyperopia (farsighted); P presbyopia; Mrx spectacle prescription;  CTL contact lenses; OD right eye; OS left eye; OU both eyes  XT exotropia; ET esotropia; PEK punctate epithelial keratitis; PEE punctate epithelial erosions; DES dry eye syndrome; MGD meibomian gland dysfunction; ATs artificial tears; PFAT's preservative free artificial tears; [place] nuclear sclerotic cataract; PSC posterior subcapsular cataract; ERM epi-retinal membrane; PVD posterior vitreous detachment; RD retinal detachment; DM diabetes mellitus; DR diabetic retinopathy; NPDR non-proliferative diabetic retinopathy; PDR proliferative diabetic retinopathy; CSME clinically significant macular edema; DME diabetic macular edema; dbh dot blot hemorrhages; CWS cotton wool spot; POAG primary open angle glaucoma; C/D cup-to-disc ratio; HVF humphrey visual field; GVF goldmann visual field; OCT optical  coherence tomography; IOP intraocular pressure; BRVO Branch retinal vein occlusion; CRVO central retinal vein occlusion; CRAO central retinal artery occlusion; BRAO branch retinal artery occlusion; RT retinal tear; SB scleral buckle; PPV pars plana vitrectomy; VH Vitreous hemorrhage; PRP panretinal laser photocoagulation; IVK intravitreal kenalog; VMT vitreomacular traction; MH Macular hole;  NVD neovascularization of the disc; NVE neovascularization elsewhere; AREDS age related eye disease study; ARMD age related macular degeneration; POAG primary open angle glaucoma; EBMD epithelial/anterior basement membrane dystrophy; ACIOL anterior chamber intraocular lens; IOL intraocular lens; PCIOL posterior chamber intraocular lens; Phaco/IOL phacoemulsification with intraocular lens placement; [place] photorefractive keratectomy; LASIK laser assisted in situ keratomileusis; HTN hypertension; DM diabetes mellitus; COPD chronic obstructive pulmonary disease

## 2021-07-08 ENCOUNTER — Ambulatory Visit (INDEPENDENT_AMBULATORY_CARE_PROVIDER_SITE_OTHER): Payer: BC Managed Care – PPO | Admitting: Ophthalmology

## 2021-07-08 ENCOUNTER — Encounter (INDEPENDENT_AMBULATORY_CARE_PROVIDER_SITE_OTHER): Payer: Self-pay | Admitting: Ophthalmology

## 2021-07-08 DIAGNOSIS — H3322 Serous retinal detachment, left eye: Secondary | ICD-10-CM

## 2021-07-08 DIAGNOSIS — H35372 Puckering of macula, left eye: Secondary | ICD-10-CM

## 2021-07-08 DIAGNOSIS — I10 Essential (primary) hypertension: Secondary | ICD-10-CM

## 2021-07-08 DIAGNOSIS — H35033 Hypertensive retinopathy, bilateral: Secondary | ICD-10-CM

## 2021-07-08 DIAGNOSIS — H35373 Puckering of macula, bilateral: Secondary | ICD-10-CM

## 2021-07-08 DIAGNOSIS — H04123 Dry eye syndrome of bilateral lacrimal glands: Secondary | ICD-10-CM

## 2021-07-08 DIAGNOSIS — Z961 Presence of intraocular lens: Secondary | ICD-10-CM

## 2021-10-18 DIAGNOSIS — R7303 Prediabetes: Secondary | ICD-10-CM | POA: Diagnosis not present

## 2021-10-18 DIAGNOSIS — I10 Essential (primary) hypertension: Secondary | ICD-10-CM | POA: Diagnosis not present

## 2021-10-18 DIAGNOSIS — R748 Abnormal levels of other serum enzymes: Secondary | ICD-10-CM | POA: Diagnosis not present

## 2021-10-18 DIAGNOSIS — E782 Mixed hyperlipidemia: Secondary | ICD-10-CM | POA: Diagnosis not present

## 2021-10-18 DIAGNOSIS — E790 Hyperuricemia without signs of inflammatory arthritis and tophaceous disease: Secondary | ICD-10-CM | POA: Diagnosis not present

## 2021-10-18 DIAGNOSIS — Z125 Encounter for screening for malignant neoplasm of prostate: Secondary | ICD-10-CM | POA: Diagnosis not present

## 2021-10-18 DIAGNOSIS — Z23 Encounter for immunization: Secondary | ICD-10-CM | POA: Diagnosis not present

## 2022-01-03 NOTE — Progress Notes (Signed)
[facility] Note  01/09/2022     CHIEF COMPLAINT Patient presents for Retina Follow Up  HISTORY OF PRESENT ILLNESS: Parker Hall is a 67 y.o. male who presents to the clinic today for:   HPI     Retina Follow Up   Patient presents with  Other.  In right eye.  This started 6 months ago.  I, the attending physician,  performed the HPI with the patient and updated documentation appropriately.        Comments   Patient here for 6 months retina follow up for ERM OD. Patient states vision about the same. OS has a little pain sometimes. Sees regular eye doctor on January 17th. Uses Refresh prn.      Last edited by Bernarda Caffey, MD on 01/09/2022  8:41 AM.     Pt states he has an eye exam schedule for January 17 and he feels like he is going to have to get new glasses, he states some days the waviness is in his vision and some days it's not, he has intermittent pain OS, but Refresh drops help  Referring physician: No referring provider defined for this encounter.  HISTORICAL INFORMATION:  Selected notes from the MEDICAL RECORD NUMBER Referred by Dr. Zigmund Daniel Ocular Hx-Total Retinal Detachment OS   CURRENT MEDICATIONS: Current Outpatient Medications (Ophthalmic Drugs)  Medication Sig   prednisoLONE acetate (PRED FORTE) 1 % ophthalmic suspension INSTILL 1 DROP INTO LEFT EYE 4 TIMES A DAY (Patient not taking: Reported on 02/18/2021)   prednisoLONE acetate (PRED FORTE) 1 % ophthalmic suspension 1 drop into left eye three times daily for a week, twice a day for a week, once a day for a week, then D/C (Patient not taking: Reported on 02/18/2021)   No current facility-administered medications for this visit. (Ophthalmic Drugs)   Current Outpatient Medications (Other)  Medication Sig   Cholecalciferol (VITAMIN D-3) 125 MCG (5000 UT) TABS Take 5,000 Units by mouth at bedtime.   doxazosin (CARDURA) 2 MG tablet Take 2 mg by mouth at bedtime.   ezetimibe (ZETIA)  10 MG tablet Take 10 mg by mouth at bedtime.   fluticasone (FLONASE) 50 MCG/ACT nasal spray Place 2 sprays into both nostrils daily as needed for allergies or rhinitis.   Multiple Vitamins-Minerals (MULTIVITAMIN WITH MINERALS) tablet Take 1 tablet by mouth at bedtime.   No current facility-administered medications for this visit. (Other)   REVIEW OF SYSTEMS: ROS   Positive for: Eyes Negative for: Constitutional, Gastrointestinal, Neurological, Skin, Genitourinary, Musculoskeletal, HENT, Endocrine, Cardiovascular, Respiratory, Psychiatric, Allergic/Imm, Heme/Lymph Last edited by Theodore Demark, COA on 01/09/2022  8:01 AM.     ALLERGIES No Known Allergies  PAST MEDICAL HISTORY Past Medical History:  Diagnosis Date   HLD (hyperlipidemia)    Hypertension    Hypertensive retinopathy    Retinal detachment    Past Surgical History:  Procedure Laterality Date   CATARACT EXTRACTION     EYE SURGERY     GAS INSERTION Left 08/26/2020   Procedure: INSERTION OF GAS;  Surgeon: Bernarda Caffey, MD;  Location: [place];  Service: Ophthalmology;  Laterality: Left;   GAS/FLUID EXCHANGE Left 08/26/2020   Procedure: GAS/FLUID EXCHANGE;  Surgeon: Bernarda Caffey, MD;  Location: [place];  Service: Ophthalmology;  Laterality: Left;   HERNIA REPAIR     LASER PHOTO ABLATION Left 08/26/2020   Procedure: LASER PHOTO ABLATION;  Surgeon: Bernarda Caffey, MD;  Location: [place];  Service: Ophthalmology;  Laterality: Left;  [place] INJECTION Left 08/26/2020   Procedure: PERFLUORONE INJECTION;  Surgeon: Bernarda Caffey, MD;  Location: [place];  Service: Ophthalmology;  Laterality: Left;   REPAIR OF COMPLEX TRACTION RETINAL DETACHMENT Left 08/26/2020   Procedure: REPAIR OF COMPLEX TRACTION RETINAL DETACHMENT;  Surgeon: Bernarda Caffey, MD;  Location: [place];  Service: Ophthalmology;  Laterality: Left;   RETINAL DETACHMENT SURGERY     skull/head surgery     VITRECTOMY 25 GAUGE WITH SCLERAL BUCKLE Left 08/26/2020    Procedure: VITRECTOMY 25 GAUGE WITH SCLERAL BUCKLE;  Surgeon: Bernarda Caffey, MD;  Location: [place];  Service: Ophthalmology;  Laterality: Left;   YAG LASER APPLICATION     FAMILY HISTORY Family History  Problem Relation Age of Onset   Hypertension Mother    SOCIAL HISTORY Social History   Tobacco Use   Smoking status: Every Day    Packs/day: 1.50    Years: 42.00    Total pack years: 63.00    Types: Cigarettes   Smokeless tobacco: Current    Types: Snuff  Vaping Use   Vaping Use: Never used  Substance Use Topics   Alcohol use: Yes    Alcohol/week: 14.0 standard drinks of alcohol    Types: 14 Standard drinks or equivalent per week   Drug use: Yes    Types: Marijuana    Comment: last use 08/15/20       OPHTHALMIC EXAM: Base Eye Exam     Visual Acuity (Snellen - Linear)       Right Left   Dist cc 20/30 -2 20/25 -1   Dist ph cc 20/20 NI    Correction: Glasses         Tonometry (Tonopen, 7:58 AM)       Right Left   Pressure 17 13         Pupils       Dark Light Shape React APD   Right 2 1 Round Slow None   Left 3 2 Round Brisk None         Visual Fields (Counting fingers)       Left Right    Full Full         Extraocular Movement       Right Left    Full, Ortho Full, Ortho         Neuro/Psych     Oriented x3: Yes   Mood/Affect: Normal         Dilation     Both eyes: 1.0% Mydriacyl, 2.5% Phenylephrine @ 7:58 AM           Slit Lamp and Fundus Exam     External Exam       Right Left   External Normal Normal         Slit Lamp Exam       Right Left   Lids/Lashes Dermatochalasis - upper lid Dermatochalasis - upper lid   Conjunctiva/Sclera White and quiet White and quiet   Cornea arcus, trace PEE, well healed cataract wound arcus, 1+Punctate epithelial erosions, well healed cataract wound   Anterior Chamber deep and clear deep, clear, narrow angles   Iris Round and moderately dilated Round and mod dilated   Lens PC IOL  in good position with open PC PC IOL in good position with open PC   Anterior Vitreous Mild syneresis, Posterior vitreous detachment, vitreous condensations post vitrectomy         Fundus Exam       Right Left  Disc Mild pallor, Sharp rim, +cupping, peripapillary nevus approx 0.7DD at 0600 disc mild Pallor, Sharp rim, +cupping   C/D Ratio 0.7 0.6   Macula Flat, good foveal reflex, RPE mottling, mild ERM with mild striae greatest SN mac, No heme or edema Flat, Good foveal reflex, ERM with striae greatest SN mac, No heme or edema   Vessels attenuated, mild tortuosity, mild AV crossing changes attenuated, Tortuous, AV crossing changes, mild copper wiring   Periphery Attached, No heme, No RT/RD Retina attached over buckle, good buckle height, good laser over buckle and around breaks, PRE: OP: bullous total RD with +corrugations and 2 HST at 1000 and 1100           Refraction     Wearing Rx       Sphere Cylinder Axis Add   Right [place] Sphere  +2.75   Left -2.75 +1.00 020 +2.75           IMAGING AND PROCEDURES  Imaging and Procedures for 01/09/2022  OCT, Retina - OU - Both Eyes       Right Eye Quality was good. Central Foveal Thickness: 281. Progression has been stable. Findings include normal foveal contour, no IRF, no SRF, epiretinal membrane, macular pucker (Mild ERM with stable, mild pucker).   Left Eye Quality was good. Central Foveal Thickness: 315. Progression has been stable. Findings include normal foveal contour, no IRF, no SRF, epiretinal membrane, macular pucker, outer retinal atrophy (Retina reattached, patchy ellipsoid thinning - improving, stable ERM and pucker superior and nasal macula).   Notes *Images captured and stored on drive  Diagnosis / Impression:  OD: Mild ERM with stable, mild pucker OS: Retina reattached, patchy ellipsoid thinning - improving, stable ERM and pucker superior and nasal macula  Clinical management:  See below  Abbreviations:  NFP - Normal foveal profile. CME - cystoid macular edema. PED - pigment epithelial detachment. IRF - intraretinal fluid. SRF - subretinal fluid. EZ - ellipsoid zone. ERM - epiretinal membrane. ORA - outer retinal atrophy. ORT - outer retinal tubulation. SRHM - subretinal hyper-reflective material. IRHM - intraretinal hyper-reflective material            ASSESSMENT/PLAN:   ICD-10-CM   1. Left retinal detachment  H33.22     2. Essential hypertension  I10     3. Hypertensive retinopathy of both eyes  H35.033 OCT, Retina - OU - Both Eyes    4. Epiretinal membrane (ERM) of both eyes  H35.373 OCT, Retina - OU - Both Eyes    5. Pseudophakia, both eyes  Z96.1     6. Dry eyes  H04.123       1. Rhegmatogenous retinal detachment, OS - bullous total mac off detachment, pt reports decreased vision for 5-6 wks - two horseshoe tears at 1000 and 1100 - s/p SBP + PPV/PFC/EL/FAX/14% C3F8 OS, 08.25.2022             - doing well             - retina attached and in good position -- good buckle height and laser around breaks              - IOP good at 13  - cont AT's QID OS and Refresh ointment qhs OS for dry eye  - f/u 9 months for DFE/OCT   2,3. Hypertensive retinopathy OU - discussed importance of tight BP control - monitor  4. Epiretinal membrane OU   - OD: mild ERM and pucker --  stable - OS: mild ERM and pucker -- stable - BCVA 20/20 OD, 20/25 OS - +metamorphopsia - no indication for surgery at this time - monitor for now - f/u 9 months DFE/OCT  5. Pseudophakia OU  - s/p CE/IOL OU (Dr. Manuella Ghazi)  - IOL in good position, doing well  - monitor  6. Dry eyes OU (OS > OD) - recommend artificial tears and lubricating ointment as needed  Ophthalmic Meds Ordered this visit:  No orders of the defined types were placed in this encounter.    Return in about 9 months (around 10/10/2022) for f/u RD OS, DFE, OCT.  There are no Patient Instructions on file for this visit.   This document  serves as a record of services personally performed by Gardiner Sleeper, MD, PhD. It was created on their behalf by Roselee Nova, COMT. The creation of this record is the provider's dictation and/or activities during the visit.  Electronically signed by: Roselee Nova, COMT 01/09/22 8:42 AM  This document serves as a record of services personally performed by Gardiner Sleeper, MD, PhD. It was created on their behalf by San Jetty. Owens Shark, OA an ophthalmic technician. The creation of this record is the provider's dictation and/or activities during the visit.    Electronically signed by: San Jetty. Owens Shark, [place] 01.08.2024 8:42 AM  Gardiner Sleeper, M.D., Ph.D. Diseases & Surgery of the Retina and Vitreous [facility]  I have reviewed the above documentation for accuracy and completeness, and I agree with the above. Gardiner Sleeper, M.D., Ph.D. 01/09/22 8:43 AM  Abbreviations: M myopia (nearsighted); A astigmatism; H hyperopia (farsighted); P presbyopia; Mrx spectacle prescription;  CTL contact lenses; OD right eye; OS left eye; OU both eyes  XT exotropia; ET esotropia; PEK punctate epithelial keratitis; PEE punctate epithelial erosions; DES dry eye syndrome; MGD meibomian gland dysfunction; ATs artificial tears; PFAT's preservative free artificial tears; [place] nuclear sclerotic cataract; PSC posterior subcapsular cataract; ERM epi-retinal membrane; PVD posterior vitreous detachment; RD retinal detachment; DM diabetes mellitus; DR diabetic retinopathy; NPDR non-proliferative diabetic retinopathy; PDR proliferative diabetic retinopathy; CSME clinically significant macular edema; DME diabetic macular edema; dbh dot blot hemorrhages; CWS cotton wool spot; POAG primary open angle glaucoma; C/D cup-to-disc ratio; HVF humphrey visual field; GVF goldmann visual field; OCT optical coherence tomography; IOP intraocular pressure; BRVO Branch retinal vein occlusion; CRVO central retinal vein occlusion;  CRAO central retinal artery occlusion; BRAO branch retinal artery occlusion; RT retinal tear; SB scleral buckle; PPV pars plana vitrectomy; VH Vitreous hemorrhage; PRP panretinal laser photocoagulation; IVK intravitreal kenalog; VMT vitreomacular traction; MH Macular hole;  NVD neovascularization of the disc; NVE neovascularization elsewhere; AREDS age related eye disease study; ARMD age related macular degeneration; POAG primary open angle glaucoma; EBMD epithelial/anterior basement membrane dystrophy; ACIOL anterior chamber intraocular lens; IOL intraocular lens; PCIOL posterior chamber intraocular lens; Phaco/IOL phacoemulsification with intraocular lens placement; [place] photorefractive keratectomy; LASIK laser assisted in situ keratomileusis; HTN hypertension; DM diabetes mellitus; COPD chronic obstructive pulmonary disease

## 2022-01-09 ENCOUNTER — Encounter (INDEPENDENT_AMBULATORY_CARE_PROVIDER_SITE_OTHER): Payer: Self-pay | Admitting: Ophthalmology

## 2022-01-09 ENCOUNTER — Ambulatory Visit (INDEPENDENT_AMBULATORY_CARE_PROVIDER_SITE_OTHER): Payer: BC Managed Care – PPO | Admitting: Ophthalmology

## 2022-01-09 DIAGNOSIS — H35373 Puckering of macula, bilateral: Secondary | ICD-10-CM | POA: Diagnosis not present

## 2022-01-09 DIAGNOSIS — H04123 Dry eye syndrome of bilateral lacrimal glands: Secondary | ICD-10-CM

## 2022-01-09 DIAGNOSIS — H35033 Hypertensive retinopathy, bilateral: Secondary | ICD-10-CM

## 2022-01-09 DIAGNOSIS — Z961 Presence of intraocular lens: Secondary | ICD-10-CM

## 2022-01-09 DIAGNOSIS — I10 Essential (primary) hypertension: Secondary | ICD-10-CM

## 2022-01-09 DIAGNOSIS — H3322 Serous retinal detachment, left eye: Secondary | ICD-10-CM

## 2022-04-27 DIAGNOSIS — I10 Essential (primary) hypertension: Secondary | ICD-10-CM | POA: Diagnosis not present

## 2022-04-27 DIAGNOSIS — R7303 Prediabetes: Secondary | ICD-10-CM | POA: Diagnosis not present

## 2022-04-27 DIAGNOSIS — R748 Abnormal levels of other serum enzymes: Secondary | ICD-10-CM | POA: Diagnosis not present

## 2022-04-27 DIAGNOSIS — Z1331 Encounter for screening for depression: Secondary | ICD-10-CM | POA: Diagnosis not present

## 2022-04-27 DIAGNOSIS — E782 Mixed hyperlipidemia: Secondary | ICD-10-CM | POA: Diagnosis not present

## 2022-04-27 DIAGNOSIS — E790 Hyperuricemia without signs of inflammatory arthritis and tophaceous disease: Secondary | ICD-10-CM | POA: Diagnosis not present

## 2022-09-12 DIAGNOSIS — M545 Low back pain, unspecified: Secondary | ICD-10-CM | POA: Diagnosis not present

## 2022-10-03 NOTE — Progress Notes (Signed)
[facility] - Clinic Note  10/09/2022     CHIEF COMPLAINT Patient presents for Retina Follow Up  HISTORY OF PRESENT ILLNESS: Parker Hall is a 67 y.o. male who presents to the clinic today for:   HPI     Retina Follow Up   Patient presents with  Retinal Break/Detachment.  In left eye.  This started 9 months ago.  Duration of 9 months.  Since onset it is stable.  I, the attending physician,  performed the HPI with the patient and updated documentation appropriately.        Comments   9 month retina follow up RD OS pt is reporting no vision changes noticed he is having some dryness using systane bid ou and it does help he has noticed a floaters in the right eye now he denies any flashes       Last edited by Rennis Chris, MD on 10/09/2022  8:23 AM.    Pt states his vision is about the same, he uses Refresh 2-3 a day  Referring physician: No referring provider defined for this encounter.  HISTORICAL INFORMATION:  Selected notes from the MEDICAL RECORD NUMBER Referred by Dr. Ashley Royalty Ocular Hx-Total Retinal Detachment OS   CURRENT MEDICATIONS: Current Outpatient Medications (Ophthalmic Drugs)  Medication Sig   prednisoLONE acetate (PRED FORTE) 1 % ophthalmic suspension INSTILL 1 DROP INTO LEFT EYE 4 TIMES A DAY (Patient not taking: Reported on 02/18/2021)   prednisoLONE acetate (PRED FORTE) 1 % ophthalmic suspension 1 drop into left eye three times daily for a week, twice a day for a week, once a day for a week, then D/C (Patient not taking: Reported on 02/18/2021)   No current facility-administered medications for this visit. (Ophthalmic Drugs)   Current Outpatient Medications (Other)  Medication Sig   Cholecalciferol (VITAMIN D-3) 125 MCG (5000 UT) TABS Take 5,000 Units by mouth at bedtime.   doxazosin (CARDURA) 2 MG tablet Take 2 mg by mouth at bedtime.   ezetimibe (ZETIA) 10 MG tablet Take 10 mg by mouth at bedtime.   fluticasone (FLONASE) 50 MCG/ACT  nasal spray Place 2 sprays into both nostrils daily as needed for allergies or rhinitis.   Multiple Vitamins-Minerals (MULTIVITAMIN WITH MINERALS) tablet Take 1 tablet by mouth at bedtime.   No current facility-administered medications for this visit. (Other)   REVIEW OF SYSTEMS: ROS   Positive for: Eyes Negative for: Constitutional, Gastrointestinal, Neurological, Skin, Genitourinary, Musculoskeletal, HENT, Endocrine, Cardiovascular, Respiratory, Psychiatric, Allergic/Imm, Heme/Lymph Last edited by Etheleen Mayhew, COT on 10/09/2022  7:52 AM.     ALLERGIES No Known Allergies  PAST MEDICAL HISTORY Past Medical History:  Diagnosis Date   HLD (hyperlipidemia)    Hypertension    Hypertensive retinopathy    Retinal detachment    Past Surgical History:  Procedure Laterality Date   CATARACT EXTRACTION     EYE SURGERY     GAS INSERTION Left 08/26/2020   Procedure: INSERTION OF GAS;  Surgeon: Rennis Chris, MD;  Location: [facility] OR;  Service: Ophthalmology;  Laterality: Left;   GAS/FLUID EXCHANGE Left 08/26/2020   Procedure: GAS/FLUID EXCHANGE;  Surgeon: Rennis Chris, MD;  [facility];  Service: Ophthalmology;  Laterality: Left;   HERNIA REPAIR     LASER PHOTO ABLATION Left 08/26/2020   Procedure: LASER PHOTO ABLATION;  Surgeon: Rennis Chris, MD;  Location: [facility] OR;  Service: Ophthalmology;  Laterality: Left;   PERFLUORONE INJECTION Left 08/26/2020   Procedure: PERFLUORONE INJECTION;  Surgeon: Vanessa Barbara,  Arlys John, MD;  Location: [facility] OR;  Service: Ophthalmology;  Laterality: Left;   REPAIR OF COMPLEX TRACTION RETINAL DETACHMENT Left 08/26/2020   Procedure: REPAIR OF COMPLEX TRACTION RETINAL DETACHMENT;  Surgeon: Rennis Chris, MD;  Location: [facility] OR;  Service: Ophthalmology;  Laterality: Left;   RETINAL DETACHMENT SURGERY     skull/head surgery     VITRECTOMY 25 GAUGE WITH SCLERAL BUCKLE Left 08/26/2020   Procedure: VITRECTOMY 25 GAUGE WITH SCLERAL BUCKLE;  Surgeon: Rennis Chris, MD;   Location: [facility] OR;  Service: Ophthalmology;  Laterality: Left;   YAG LASER APPLICATION     FAMILY HISTORY Family History  Problem Relation Age of Onset   Hypertension Mother    SOCIAL HISTORY Social History   Tobacco Use   Smoking status: Every Day    Current packs/day: 1.50    Average packs/day: 1.5 packs/day for 42.0 years (63.0 ttl pk-yrs)    Types: Cigarettes   Smokeless tobacco: Current    Types: Snuff  Vaping Use   Vaping status: Never Used  Substance Use Topics   Alcohol use: Yes    Alcohol/week: 14.0 standard drinks of alcohol    Types: 14 Standard drinks or equivalent per week   Drug use: Yes    Types: Marijuana    Comment: last use 08/15/20       OPHTHALMIC EXAM: Base Eye Exam     Visual Acuity (Snellen - Linear)       Right Left   Dist cc 20/25 20/25   Dist ph cc NI NI    Correction: Glasses         Tonometry (Tonopen, 7:56 AM)       Right Left   Pressure 12 10         Pupils       Pupils Dark Light Shape React APD   Right PERRL 2 1 Round Brisk None   Left PERRL 2 1 Round Brisk None         Visual Fields       Left Right    Full Full         Extraocular Movement       Right Left    Full, Ortho Full, Ortho         Neuro/Psych     Oriented x3: Yes   Mood/Affect: Normal         Dilation     Both eyes: 2.5% Phenylephrine @ 7:56 AM           Slit Lamp and Fundus Exam     External Exam       Right Left   External Normal Normal         Slit Lamp Exam       Right Left   Lids/Lashes Dermatochalasis - upper lid, mild Telangiectasia Dermatochalasis - upper lid   Conjunctiva/Sclera White and quiet White and quiet   Cornea arcus, well healed cataract wound, trace tear film debris arcus, 2+Punctate epithelial erosions, well healed cataract wound, tear film debris   Anterior Chamber deep and clear deep, clear, narrow angles   Iris Round and dilated round and poorly dilated to 4.31mm   Lens PC IOL in good position  with open PC PC IOL in good position with open PC   Anterior Vitreous Mild syneresis, Posterior vitreous detachment, vitreous condensations post vitrectomy         Fundus Exam       Right Left   Disc Mild pallor, Sharp rim, +  cupping, peripapillary nevus approx 0.7DD at 0600 disc mild Pallor, Sharp rim, +cupping   C/D Ratio 0.7 0.6   Macula Flat, good foveal reflex, RPE mottling, mild ERM with mild striae greatest SN mac, No heme or edema Flat, Good foveal reflex, ERM with striae greatest SN mac, No heme or edema   Vessels attenuated, mild tortuosity, mild AV crossing changes attenuated, Tortuous   Periphery Attached, No heme, No RT/RD Retina attached over buckle, good buckle height, good laser over buckle and around breaks, PRE: OP: bullous total RD with +corrugations and 2 HST at 1000 and 1100           Refraction     Wearing Rx       Sphere Cylinder Axis Add   Right [place] Sphere  +2.75   Left -2.75 +1.00 020 +2.75           IMAGING AND PROCEDURES  Imaging and Procedures for 10/09/2022  OCT, Retina - OU - Both Eyes       Right Eye Quality was good. Central Foveal Thickness: 276. Progression has been stable. Findings include normal foveal contour, no IRF, no SRF, epiretinal membrane, macular pucker (Mild ERM with stable, mild pucker).   Left Eye Quality was good. Central Foveal Thickness: 304. Progression has been stable. Findings include normal foveal contour, no IRF, no SRF, epiretinal membrane, macular pucker, outer retinal atrophy (Retina reattached, patchy ellipsoid thinning - improved, stable ERM and pucker superior and nasal macula --mild).   Notes *Images captured and stored on drive  Diagnosis / Impression:  OD: Mild ERM with stable, mild pucker OS: Retina reattached, patchy ellipsoid thinning - improved, stable ERM and pucker superior and nasal macula -- mild  Clinical management:  See below  Abbreviations: NFP - Normal foveal profile. CME - cystoid  macular edema. PED - pigment epithelial detachment. IRF - intraretinal fluid. SRF - subretinal fluid. EZ - ellipsoid zone. ERM - epiretinal membrane. ORA - outer retinal atrophy. ORT - outer retinal tubulation. SRHM - subretinal hyper-reflective material. IRHM - intraretinal hyper-reflective material            ASSESSMENT/PLAN:   ICD-10-CM   1. Left retinal detachment  H33.22 OCT, Retina - OU - Both Eyes    2. Essential hypertension  I10     3. Hypertensive retinopathy of both eyes  H35.033     4. Epiretinal membrane (ERM) of both eyes  H35.373     5. Pseudophakia, both eyes  Z96.1     6. Dry eyes  H04.123      1. Rhegmatogenous retinal detachment, OS - bullous total mac off detachment, pt reported decreased vision for 5-6 wks - two horseshoe tears at 1000 and 1100 - s/p SBP + PPV/PFC/EL/FAX/14% C3F8 OS, 08.25.2022             - doing well             - retina attached and in good position -- good buckle height and laser around breaks   - BCVA OS 20/25 -- stable             - IOP good at 10  - cont AT's QID OS and Refresh ointment qhs OS for dry eye  - f/u 1 yr for DFE/OCT   2,3. Hypertensive retinopathy OU - discussed importance of tight BP control - monitor  4. Epiretinal membrane OU   - OD: mild ERM and pucker -- stable - OS: mild ERM and pucker --  stable - BCVA 20/25 OU - +metamorphopsia - no indication for surgery at this time - monitor - f/u 1 yr -  DFE, OCT  5. Pseudophakia OU  - s/p CE/IOL OU (Dr. Sherryll Burger)  - IOL in good position, doing well  - monitor  6. Dry eyes OU (OS > OD) - recommend artificial tears and lubricating ointment as needed  Ophthalmic Meds Ordered this visit:  No orders of the defined types were placed in this encounter.    Return in about 1 year (around 10/09/2023) for f/u RD OS, DFE, OCT.  There are no Patient Instructions on file for this visit.   This document serves as a record of services personally performed by Karie Chimera, MD, PhD. It was created on their behalf by Annalee Genta, COMT. The creation of this record is the provider's dictation and/or activities during the visit.  Electronically signed by: Annalee Genta, COMT 10/09/22 11:08 PM  This document serves as a record of services personally performed by Karie Chimera, MD, PhD. It was created on their behalf by Glee Arvin. Manson Passey, OA an ophthalmic technician. The creation of this record is the provider's dictation and/or activities during the visit.    Electronically signed by: Glee Arvin. Manson Passey, OA 10/09/22 11:08 PM  Karie Chimera, M.D., Ph.D. Diseases & Surgery of the Retina and Vitreous [facility]  I have reviewed the above documentation for accuracy and completeness, and I agree with the above. Karie Chimera, M.D., Ph.D. 10/09/22 11:10 PM   Abbreviations: M myopia (nearsighted); A astigmatism; H hyperopia (farsighted); P presbyopia; Mrx spectacle prescription;  CTL contact lenses; OD right eye; OS left eye; OU both eyes  XT exotropia; ET esotropia; PEK punctate epithelial keratitis; PEE punctate epithelial erosions; DES dry eye syndrome; MGD meibomian gland dysfunction; ATs artificial tears; PFAT's preservative free artificial tears; NSC nuclear sclerotic cataract; PSC posterior subcapsular cataract; ERM epi-retinal membrane; PVD posterior vitreous detachment; RD retinal detachment; DM diabetes mellitus; DR diabetic retinopathy; NPDR non-proliferative diabetic retinopathy; PDR proliferative diabetic retinopathy; CSME clinically significant macular edema; DME diabetic macular edema; dbh dot blot hemorrhages; CWS cotton wool spot; POAG primary open angle glaucoma; C/D cup-to-disc ratio; HVF humphrey visual field; GVF goldmann visual field; OCT optical coherence tomography; IOP intraocular pressure; BRVO Branch retinal vein occlusion; CRVO central retinal vein occlusion; CRAO central retinal artery occlusion; BRAO branch retinal  artery occlusion; RT retinal tear; SB scleral buckle; PPV pars plana vitrectomy; VH Vitreous hemorrhage; PRP panretinal laser photocoagulation; IVK intravitreal kenalog; VMT vitreomacular traction; MH Macular hole;  NVD neovascularization of the disc; NVE neovascularization elsewhere; AREDS age related eye disease study; ARMD age related macular degeneration; POAG primary open angle glaucoma; EBMD epithelial/anterior basement membrane dystrophy; ACIOL anterior chamber intraocular lens; IOL intraocular lens; PCIOL posterior chamber intraocular lens; Phaco/IOL phacoemulsification with intraocular lens placement; PRK photorefractive keratectomy; LASIK laser assisted in situ keratomileusis; HTN hypertension; DM diabetes mellitus; COPD chronic obstructive pulmonary disease

## 2022-10-09 ENCOUNTER — Ambulatory Visit (INDEPENDENT_AMBULATORY_CARE_PROVIDER_SITE_OTHER): Payer: BC Managed Care – PPO | Admitting: Ophthalmology

## 2022-10-09 ENCOUNTER — Encounter (INDEPENDENT_AMBULATORY_CARE_PROVIDER_SITE_OTHER): Payer: Self-pay | Admitting: Ophthalmology

## 2022-10-09 DIAGNOSIS — H3322 Serous retinal detachment, left eye: Secondary | ICD-10-CM

## 2022-10-09 DIAGNOSIS — I10 Essential (primary) hypertension: Secondary | ICD-10-CM

## 2022-10-09 DIAGNOSIS — H35033 Hypertensive retinopathy, bilateral: Secondary | ICD-10-CM

## 2022-10-09 DIAGNOSIS — H35373 Puckering of macula, bilateral: Secondary | ICD-10-CM | POA: Diagnosis not present

## 2022-10-09 DIAGNOSIS — Z961 Presence of intraocular lens: Secondary | ICD-10-CM

## 2022-10-09 DIAGNOSIS — H04123 Dry eye syndrome of bilateral lacrimal glands: Secondary | ICD-10-CM

## 2022-11-03 DIAGNOSIS — E790 Hyperuricemia without signs of inflammatory arthritis and tophaceous disease: Secondary | ICD-10-CM | POA: Diagnosis not present

## 2022-11-03 DIAGNOSIS — I10 Essential (primary) hypertension: Secondary | ICD-10-CM | POA: Diagnosis not present

## 2022-11-03 DIAGNOSIS — R748 Abnormal levels of other serum enzymes: Secondary | ICD-10-CM | POA: Diagnosis not present

## 2022-11-03 DIAGNOSIS — Z125 Encounter for screening for malignant neoplasm of prostate: Secondary | ICD-10-CM | POA: Diagnosis not present

## 2022-11-03 DIAGNOSIS — Z23 Encounter for immunization: Secondary | ICD-10-CM | POA: Diagnosis not present

## 2022-11-03 DIAGNOSIS — E782 Mixed hyperlipidemia: Secondary | ICD-10-CM | POA: Diagnosis not present

## 2022-11-03 DIAGNOSIS — R7303 Prediabetes: Secondary | ICD-10-CM | POA: Diagnosis not present

## 2022-11-21 DIAGNOSIS — Z1211 Encounter for screening for malignant neoplasm of colon: Secondary | ICD-10-CM | POA: Diagnosis not present

## 2022-11-21 DIAGNOSIS — Z1212 Encounter for screening for malignant neoplasm of rectum: Secondary | ICD-10-CM | POA: Diagnosis not present

## 2022-11-28 LAB — COLOGUARD: COLOGUARD: POSITIVE — AB

## 2023-05-09 DIAGNOSIS — E782 Mixed hyperlipidemia: Secondary | ICD-10-CM | POA: Diagnosis not present

## 2023-05-09 DIAGNOSIS — I10 Essential (primary) hypertension: Secondary | ICD-10-CM | POA: Diagnosis not present

## 2023-05-09 DIAGNOSIS — Z1331 Encounter for screening for depression: Secondary | ICD-10-CM | POA: Diagnosis not present

## 2023-05-09 DIAGNOSIS — Z23 Encounter for immunization: Secondary | ICD-10-CM | POA: Diagnosis not present

## 2023-05-09 DIAGNOSIS — R748 Abnormal levels of other serum enzymes: Secondary | ICD-10-CM | POA: Diagnosis not present

## 2023-05-09 DIAGNOSIS — R195 Other fecal abnormalities: Secondary | ICD-10-CM | POA: Diagnosis not present

## 2023-05-09 DIAGNOSIS — E790 Hyperuricemia without signs of inflammatory arthritis and tophaceous disease: Secondary | ICD-10-CM | POA: Diagnosis not present

## 2023-05-09 DIAGNOSIS — R7303 Prediabetes: Secondary | ICD-10-CM | POA: Diagnosis not present

## 2023-10-01 NOTE — Progress Notes (Signed)
 [facility] - Clinic Note  10/08/2023     CHIEF COMPLAINT Patient presents for Retina Follow Up  HISTORY OF PRESENT ILLNESS: Parker Hall is a 68 y.o. male who presents to the clinic today for:   HPI     Retina Follow Up   Patient presents with  Other.  In left eye.  This started 1 year ago.  I, the attending physician,  performed the HPI with the patient and updated documentation appropriately.        Comments   Patient here for 1 year follow up for RD OS. Patient states vision doing pretty good. On occasion has pain OS.       Last edited by Valdemar Rogue, MD on 10/08/2023  8:30 AM.     Pt states his vision is doing well.   Referring physician: No referring provider defined for this encounter.  HISTORICAL INFORMATION:  Selected notes from the MEDICAL RECORD NUMBER Referred by Dr. Alvia Ocular Hx-Total Retinal Detachment OS   CURRENT MEDICATIONS: Current Outpatient Medications (Ophthalmic Drugs)  Medication Sig   prednisoLONE  acetate (PRED FORTE ) 1 % ophthalmic suspension INSTILL 1 DROP INTO LEFT EYE 4 TIMES A DAY (Patient not taking: Reported on 10/08/2023)   prednisoLONE  acetate (PRED FORTE ) 1 % ophthalmic suspension 1 drop into left eye three times daily for a week, twice a day for a week, once a day for a week, then D/C (Patient not taking: Reported on 10/08/2023)   No current facility-administered medications for this visit. (Ophthalmic Drugs)   Current Outpatient Medications (Other)  Medication Sig   Cholecalciferol (VITAMIN D-3) 125 MCG (5000 UT) TABS Take 5,000 Units by mouth at bedtime.   doxazosin (CARDURA) 2 MG tablet Take 2 mg by mouth at bedtime.   ezetimibe (ZETIA) 10 MG tablet Take 10 mg by mouth at bedtime.   fluticasone (FLONASE) 50 MCG/ACT nasal spray Place 2 sprays into both nostrils daily as needed for allergies or rhinitis.   Multiple Vitamins-Minerals (MULTIVITAMIN WITH MINERALS) tablet Take 1 tablet by mouth at bedtime.    No current facility-administered medications for this visit. (Other)   REVIEW OF SYSTEMS: ROS   Positive for: Eyes Negative for: Constitutional, Gastrointestinal, Neurological, Skin, Genitourinary, Musculoskeletal, HENT, Endocrine, Cardiovascular, Respiratory, Psychiatric, Allergic/Imm, Heme/Lymph Last edited by Orval Asberry RAMAN, COA on 10/08/2023  8:25 AM.      ALLERGIES No Known Allergies  PAST MEDICAL HISTORY Past Medical History:  Diagnosis Date   HLD (hyperlipidemia)    Hypertension    Hypertensive retinopathy    Retinal detachment    Past Surgical History:  Procedure Laterality Date   CATARACT EXTRACTION     EYE SURGERY     GAS INSERTION Left 08/26/2020   Procedure: INSERTION OF GAS;  Surgeon: Valdemar Rogue, MD;  Location: [facility] OR;  Service: Ophthalmology;  Laterality: Left;   GAS/FLUID EXCHANGE Left 08/26/2020   Procedure: GAS/FLUID EXCHANGE;  Surgeon: Valdemar Rogue, MD;  Location: [facility] OR;  Service: Ophthalmology;  Laterality: Left;   HERNIA REPAIR     LASER PHOTO ABLATION Left 08/26/2020   Procedure: LASER PHOTO ABLATION;  Surgeon: Valdemar Rogue, MD;  Location: [facility] OR;  Service: Ophthalmology;  Laterality: Left;   PERFLUORONE INJECTION Left 08/26/2020   Procedure: PERFLUORONE INJECTION;  Surgeon: Valdemar Rogue, MD;  Location: [facility] OR;  Service: Ophthalmology;  Laterality: Left;   REPAIR OF COMPLEX TRACTION RETINAL DETACHMENT Left 08/26/2020   Procedure: REPAIR OF COMPLEX TRACTION RETINAL DETACHMENT;  Surgeon: Valdemar Rogue, MD;  Location: [facility] OR;  Service: Ophthalmology;  Laterality: Left;   RETINAL DETACHMENT SURGERY     skull/head surgery     VITRECTOMY 25 GAUGE WITH SCLERAL BUCKLE Left 08/26/2020   Procedure: VITRECTOMY 25 GAUGE WITH SCLERAL BUCKLE;  Surgeon: Valdemar Rogue, MD;  Location: [facility] OR;  Service: Ophthalmology;  Laterality: Left;   YAG LASER APPLICATION     FAMILY HISTORY Family History  Problem Relation Age of Onset   Hypertension Mother    SOCIAL  HISTORY Social History   Tobacco Use   Smoking status: Every Day    Current packs/day: 1.50    Average packs/day: 1.5 packs/day for 42.0 years (63.0 ttl pk-yrs)    Types: Cigarettes   Smokeless tobacco: Current    Types: Snuff  Vaping Use   Vaping status: Never Used  Substance Use Topics   Alcohol use: Yes    Alcohol/week: 14.0 standard drinks of alcohol    Types: 14 Standard drinks or equivalent per week   Drug use: Yes    Types: Marijuana    Comment: last use 08/15/20       OPHTHALMIC EXAM: Base Eye Exam     Visual Acuity (Snellen - Linear)       Right Left   Dist cc 20/25 20/25 -2    Correction: Glasses         Tonometry (Tonopen, 8:23 AM)       Right Left   Pressure 16 15         Pupils       Dark Light Shape React APD   Right 2 1 Round Brisk None   Left 2 1 Round Brisk None         Visual Fields (Counting fingers)       Left Right    Full Full         Extraocular Movement       Right Left    Full, Ortho Full, Ortho         Neuro/Psych     Oriented x3: Yes   Mood/Affect: Normal         Dilation     Both eyes: 1.0% Mydriacyl , 2.5% Phenylephrine  @ 8:23 AM           Slit Lamp and Fundus Exam     External Exam       Right Left   External Normal Normal         Slit Lamp Exam       Right Left   Lids/Lashes Dermatochalasis - upper lid, mild Telangiectasia Dermatochalasis - upper lid   Conjunctiva/Sclera White and quiet White and quiet   Cornea arcus, well healed cataract wound, trace tear film debris arcus, 1+Punctate epithelial erosions, well healed cataract wound, tear film debris   Anterior Chamber deep and clear deep, clear, narrow angles   Iris Round and dilated, Round and moderately dilated round and poorly dilated to 3.35mm   Lens PC IOL in good position with open PC PC IOL in good position with open PC   Anterior Vitreous Mild syneresis, Posterior vitreous detachment, vitreous condensations post vitrectomy          Fundus Exam       Right Left   Disc Mild pallor, Sharp rim, +cupping, peripapillary nevus approx 0.7DD at 0600 disc mild Pallor, Sharp rim, +cupping   C/D Ratio 0.7 0.7   Macula Flat, good foveal reflex, RPE mottling, mild ERM with mild striae greatest SN mac, No heme  or edema Flat, Good foveal reflex, ERM with striae greatest SN mac, No heme or edema   Vessels attenuated, mild tortuosity, mild AV crossing changes attenuated, Tortuous   Periphery Attached, No heme, No RT/RD Retina attached over buckle, good buckle height, good laser over buckle and around breaks, PRE: OP: bullous total RD with +corrugations and 2 HST at 1000 and 1100           Refraction     Wearing Rx       Sphere Cylinder Axis Add   Right +0.25 Sphere 019 +2.75   Left -3.00 +1.25 019 +2.75           IMAGING AND PROCEDURES  Imaging and Procedures for 10/08/2023  OCT, Retina - OU - Both Eyes       Right Eye Quality was good. Central Foveal Thickness: 278. Progression has been stable. Findings include normal foveal contour, no IRF, no SRF, epiretinal membrane, macular pucker (Mild ERM with mild pucker-- stable).   Left Eye Quality was good. Central Foveal Thickness: 309. Progression has been stable. Findings include normal foveal contour, no IRF, no SRF, epiretinal membrane, macular pucker, outer retinal atrophy (Retina reattached, patchy ellipsoid thinning - improved, ERM and pucker superior and nasal macula --mild).   Notes *Images captured and stored on drive  Diagnosis / Impression:  OD: Mild ERM with mild pucker-- stable OS: Retina reattached, patchy ellipsoid thinning - improved, stable ERM and pucker superior and nasal macula -- mild  Clinical management:  See below  Abbreviations: NFP - Normal foveal profile. CME - cystoid macular edema. PED - pigment epithelial detachment. IRF - intraretinal fluid. SRF - subretinal fluid. EZ - ellipsoid zone. ERM - epiretinal membrane. ORA - outer  retinal atrophy. ORT - outer retinal tubulation. SRHM - subretinal hyper-reflective material. IRHM - intraretinal hyper-reflective material             ASSESSMENT/PLAN:   ICD-10-CM   1. Left retinal detachment  H33.22 OCT, Retina - OU - Both Eyes    2. Essential hypertension  I10     3. Hypertensive retinopathy of both eyes  H35.033     4. Epiretinal membrane (ERM) of both eyes  H35.373     5. Pseudophakia, both eyes  Z96.1     6. Dry eyes  H04.123      1. Rhegmatogenous retinal detachment, OS - bullous total mac off detachment, pt reported decreased vision for 5-6 wks - two horseshoe tears at 1000 and 1100 - s/p SBP + PPV/PFC/EL/FAX/14% C3F8 OS, 08.25.2022             - doing well - retina attached and in good position -- good buckle height and laser around breaks   - BCVA OS 20/25 -- stable             - IOP good at 16  - cont AT's QID OS and Refresh ointment qhs OS for dry eye  - f/u 1 yr for DFE/OCT   2,3. Hypertensive retinopathy OU - discussed importance of tight BP control - monitor  4. Epiretinal membrane OU   - OD: mild ERM and pucker -- stable - OS: mild ERM and pucker -- stable - BCVA 20/25 OU - +metamorphopsia - no indication for surgery at this time - monitor - f/u 1 yr -  DFE, OCT  5. Pseudophakia OU  - s/p CE/IOL OU (Dr. Maree)  - IOL in good position, doing well  - monitor  6. Dry  eyes OU (OS > OD) - recommend artificial tears and lubricating ointment as needed  Ophthalmic Meds Ordered this visit:  No orders of the defined types were placed in this encounter.    Return in about 1 year (around 10/07/2024) for f/u, RD, OS, DFE, OCT.  There are no Patient Instructions on file for this visit.  This document serves as a record of services personally performed by Redell JUDITHANN Hans, MD, PhD. It was created on their behalf by Avelina Pereyra, COA an ophthalmic technician. The creation of this record is the provider's dictation and/or activities during  the visit.   Electronically signed by: Avelina GORMAN Pereyra, COT  10/10/23  12:54 PM   This document serves as a record of services personally performed by Redell JUDITHANN Hans, MD, PhD. It was created on their behalf by Wanda GEANNIE Keens, COT an ophthalmic technician. The creation of this record is the provider's dictation and/or activities during the visit.    Electronically signed by:  Wanda GEANNIE Keens, COT  10/10/23 12:54 PM  Redell JUDITHANN Hans, M.D., Ph.D. Diseases & Surgery of the Retina and Vitreous [facility]  I have reviewed the above documentation for accuracy and completeness, and I agree with the above. Redell JUDITHANN Hans, M.D., Ph.D. 10/10/23 12:54 PM    Abbreviations: M myopia (nearsighted); A astigmatism; H hyperopia (farsighted); P presbyopia; Mrx spectacle prescription;  CTL contact lenses; OD right eye; OS left eye; OU both eyes  XT exotropia; ET esotropia; PEK punctate epithelial keratitis; PEE punctate epithelial erosions; DES dry eye syndrome; MGD meibomian gland dysfunction; ATs artificial tears; PFAT's preservative free artificial tears; NSC nuclear sclerotic cataract; PSC posterior subcapsular cataract; ERM epi-retinal membrane; PVD posterior vitreous detachment; RD retinal detachment; DM diabetes mellitus; DR diabetic retinopathy; NPDR non-proliferative diabetic retinopathy; PDR proliferative diabetic retinopathy; CSME clinically significant macular edema; DME diabetic macular edema; dbh dot blot hemorrhages; CWS cotton wool spot; POAG primary open angle glaucoma; C/D cup-to-disc ratio; HVF humphrey visual field; GVF goldmann visual field; OCT optical coherence tomography; IOP intraocular pressure; BRVO Branch retinal vein occlusion; CRVO central retinal vein occlusion; CRAO central retinal artery occlusion; BRAO branch retinal artery occlusion; RT retinal tear; SB scleral buckle; PPV pars plana vitrectomy; VH Vitreous hemorrhage; PRP panretinal laser  photocoagulation; IVK intravitreal kenalog ; VMT vitreomacular traction; MH Macular hole;  NVD neovascularization of the disc; NVE neovascularization elsewhere; AREDS age related eye disease study; ARMD age related macular degeneration; POAG primary open angle glaucoma; EBMD epithelial/anterior basement membrane dystrophy; ACIOL anterior chamber intraocular lens; IOL intraocular lens; PCIOL posterior chamber intraocular lens; Phaco/IOL phacoemulsification with intraocular lens placement; PRK photorefractive keratectomy; LASIK laser assisted in situ keratomileusis; HTN hypertension; DM diabetes mellitus; COPD chronic obstructive pulmonary disease

## 2023-10-08 ENCOUNTER — Encounter (INDEPENDENT_AMBULATORY_CARE_PROVIDER_SITE_OTHER): Payer: Self-pay | Admitting: Ophthalmology

## 2023-10-08 ENCOUNTER — Ambulatory Visit (INDEPENDENT_AMBULATORY_CARE_PROVIDER_SITE_OTHER): Payer: BC Managed Care – PPO | Admitting: Ophthalmology

## 2023-10-08 DIAGNOSIS — I10 Essential (primary) hypertension: Secondary | ICD-10-CM | POA: Diagnosis not present

## 2023-10-08 DIAGNOSIS — Z961 Presence of intraocular lens: Secondary | ICD-10-CM

## 2023-10-08 DIAGNOSIS — H3322 Serous retinal detachment, left eye: Secondary | ICD-10-CM | POA: Diagnosis not present

## 2023-10-08 DIAGNOSIS — H35033 Hypertensive retinopathy, bilateral: Secondary | ICD-10-CM | POA: Diagnosis not present

## 2023-10-08 DIAGNOSIS — H04123 Dry eye syndrome of bilateral lacrimal glands: Secondary | ICD-10-CM

## 2023-10-08 DIAGNOSIS — H35373 Puckering of macula, bilateral: Secondary | ICD-10-CM | POA: Diagnosis not present

## 2023-11-13 DIAGNOSIS — R748 Abnormal levels of other serum enzymes: Secondary | ICD-10-CM | POA: Diagnosis not present

## 2023-11-13 DIAGNOSIS — R7303 Prediabetes: Secondary | ICD-10-CM | POA: Diagnosis not present

## 2023-11-13 DIAGNOSIS — I10 Essential (primary) hypertension: Secondary | ICD-10-CM | POA: Diagnosis not present

## 2023-11-13 DIAGNOSIS — E782 Mixed hyperlipidemia: Secondary | ICD-10-CM | POA: Diagnosis not present

## 2023-11-13 DIAGNOSIS — Z125 Encounter for screening for malignant neoplasm of prostate: Secondary | ICD-10-CM | POA: Diagnosis not present

## 2023-11-13 DIAGNOSIS — Z23 Encounter for immunization: Secondary | ICD-10-CM | POA: Diagnosis not present

## 2023-11-13 DIAGNOSIS — E790 Hyperuricemia without signs of inflammatory arthritis and tophaceous disease: Secondary | ICD-10-CM | POA: Diagnosis not present

## 2024-10-07 ENCOUNTER — Encounter (INDEPENDENT_AMBULATORY_CARE_PROVIDER_SITE_OTHER): Admitting: Ophthalmology
